# Patient Record
Sex: Female | Born: 1950
Health system: Southern US, Community
[De-identification: ages and names within clinical notes are randomized; demographics above are authoritative.]

## PROBLEM LIST (undated history)

## (undated) DIAGNOSIS — C801 Malignant (primary) neoplasm, unspecified: Secondary | ICD-10-CM

## (undated) DIAGNOSIS — M1990 Unspecified osteoarthritis, unspecified site: Secondary | ICD-10-CM

## (undated) DIAGNOSIS — N6320 Unspecified lump in the left breast, unspecified quadrant: Secondary | ICD-10-CM

## (undated) DIAGNOSIS — E7800 Pure hypercholesterolemia, unspecified: Secondary | ICD-10-CM

## (undated) DIAGNOSIS — E785 Hyperlipidemia, unspecified: Secondary | ICD-10-CM

## (undated) DIAGNOSIS — R7303 Prediabetes: Secondary | ICD-10-CM

## (undated) DIAGNOSIS — I10 Essential (primary) hypertension: Secondary | ICD-10-CM

## (undated) DIAGNOSIS — L409 Psoriasis, unspecified: Secondary | ICD-10-CM

## (undated) DIAGNOSIS — N950 Postmenopausal bleeding: Secondary | ICD-10-CM

## (undated) HISTORY — DX: Prediabetes: R73.03

## (undated) HISTORY — PX: TUBAL LIGATION: SHX77

## (undated) HISTORY — DX: Psoriasis, unspecified: L40.9

## (undated) HISTORY — DX: Postmenopausal bleeding: N95.0

## (undated) HISTORY — PX: ABDOMINAL HYSTERECTOMY: SHX81

## (undated) HISTORY — PX: BREAST EXCISIONAL BIOPSY: SUR124

## (undated) HISTORY — PX: CHOLECYSTECTOMY: SHX55

## (undated) HISTORY — PX: WISDOM TOOTH EXTRACTION: SHX21

---

## 1998-11-20 ENCOUNTER — Encounter: Payer: Self-pay | Admitting: Gynecology

## 1998-11-20 ENCOUNTER — Ambulatory Visit (HOSPITAL_COMMUNITY): Admission: RE | Admit: 1998-11-20 | Discharge: 1998-11-20 | Payer: Self-pay | Admitting: Gynecology

## 1999-05-10 ENCOUNTER — Other Ambulatory Visit: Admission: RE | Admit: 1999-05-10 | Discharge: 1999-05-10 | Payer: Self-pay | Admitting: Gynecology

## 2000-01-25 ENCOUNTER — Encounter: Admission: RE | Admit: 2000-01-25 | Discharge: 2000-01-25 | Payer: Self-pay | Admitting: Gynecology

## 2000-01-25 ENCOUNTER — Encounter: Payer: Self-pay | Admitting: Gynecology

## 2000-07-29 ENCOUNTER — Other Ambulatory Visit: Admission: RE | Admit: 2000-07-29 | Discharge: 2000-07-29 | Payer: Self-pay | Admitting: Gynecology

## 2001-01-29 ENCOUNTER — Encounter: Admission: RE | Admit: 2001-01-29 | Discharge: 2001-01-29 | Payer: Self-pay | Admitting: Gynecology

## 2001-01-29 ENCOUNTER — Encounter: Payer: Self-pay | Admitting: Gynecology

## 2001-10-20 ENCOUNTER — Other Ambulatory Visit: Admission: RE | Admit: 2001-10-20 | Discharge: 2001-10-20 | Payer: Self-pay | Admitting: Gynecology

## 2002-02-04 ENCOUNTER — Encounter: Payer: Self-pay | Admitting: Gynecology

## 2002-02-04 ENCOUNTER — Encounter: Admission: RE | Admit: 2002-02-04 | Discharge: 2002-02-04 | Payer: Self-pay | Admitting: Gynecology

## 2002-10-25 ENCOUNTER — Other Ambulatory Visit: Admission: RE | Admit: 2002-10-25 | Discharge: 2002-10-25 | Payer: Self-pay | Admitting: Gynecology

## 2003-05-05 ENCOUNTER — Encounter: Payer: Self-pay | Admitting: Gynecology

## 2003-05-05 ENCOUNTER — Encounter: Admission: RE | Admit: 2003-05-05 | Discharge: 2003-05-05 | Payer: Self-pay | Admitting: Gynecology

## 2003-10-27 ENCOUNTER — Other Ambulatory Visit: Admission: RE | Admit: 2003-10-27 | Discharge: 2003-10-27 | Payer: Self-pay | Admitting: Gynecology

## 2004-05-21 ENCOUNTER — Encounter: Admission: RE | Admit: 2004-05-21 | Discharge: 2004-05-21 | Payer: Self-pay | Admitting: Gynecology

## 2004-10-08 ENCOUNTER — Ambulatory Visit (HOSPITAL_COMMUNITY): Admission: RE | Admit: 2004-10-08 | Discharge: 2004-10-08 | Payer: Self-pay | Admitting: Gastroenterology

## 2004-12-13 ENCOUNTER — Other Ambulatory Visit: Admission: RE | Admit: 2004-12-13 | Discharge: 2004-12-13 | Payer: Self-pay | Admitting: Gynecology

## 2005-01-01 ENCOUNTER — Encounter: Admission: RE | Admit: 2005-01-01 | Discharge: 2005-01-01 | Payer: Self-pay | Admitting: Gynecology

## 2005-09-03 ENCOUNTER — Encounter: Admission: RE | Admit: 2005-09-03 | Discharge: 2005-09-03 | Payer: Self-pay | Admitting: Gynecology

## 2006-01-27 ENCOUNTER — Other Ambulatory Visit: Admission: RE | Admit: 2006-01-27 | Discharge: 2006-01-27 | Payer: Self-pay | Admitting: Gynecology

## 2006-08-28 ENCOUNTER — Emergency Department (HOSPITAL_COMMUNITY): Admission: EM | Admit: 2006-08-28 | Discharge: 2006-08-28 | Payer: Self-pay | Admitting: Emergency Medicine

## 2006-09-09 ENCOUNTER — Encounter: Admission: RE | Admit: 2006-09-09 | Discharge: 2006-09-09 | Payer: Self-pay | Admitting: Gynecology

## 2007-01-20 ENCOUNTER — Encounter: Admission: RE | Admit: 2007-01-20 | Discharge: 2007-01-20 | Payer: Self-pay | Admitting: Family Medicine

## 2007-02-12 ENCOUNTER — Ambulatory Visit (HOSPITAL_COMMUNITY): Admission: RE | Admit: 2007-02-12 | Discharge: 2007-02-12 | Payer: Self-pay | Admitting: Neurology

## 2007-11-24 ENCOUNTER — Encounter: Admission: RE | Admit: 2007-11-24 | Discharge: 2007-11-24 | Payer: Self-pay | Admitting: Gynecology

## 2008-11-25 ENCOUNTER — Encounter: Admission: RE | Admit: 2008-11-25 | Discharge: 2008-11-25 | Payer: Self-pay | Admitting: Gynecology

## 2009-10-24 ENCOUNTER — Ambulatory Visit (HOSPITAL_COMMUNITY): Admission: RE | Admit: 2009-10-24 | Discharge: 2009-10-24 | Payer: Self-pay | Admitting: Gastroenterology

## 2009-10-24 ENCOUNTER — Encounter (INDEPENDENT_AMBULATORY_CARE_PROVIDER_SITE_OTHER): Payer: Self-pay | Admitting: Gastroenterology

## 2009-11-30 ENCOUNTER — Encounter: Admission: RE | Admit: 2009-11-30 | Discharge: 2009-11-30 | Payer: Self-pay | Admitting: Gynecology

## 2011-01-25 ENCOUNTER — Other Ambulatory Visit: Payer: Self-pay | Admitting: Gynecology

## 2011-01-25 DIAGNOSIS — Z1231 Encounter for screening mammogram for malignant neoplasm of breast: Secondary | ICD-10-CM

## 2011-02-05 ENCOUNTER — Ambulatory Visit
Admission: RE | Admit: 2011-02-05 | Discharge: 2011-02-05 | Disposition: A | Discharge status: Home / Self Care | Payer: Commercial Managed Care - PPO | Source: Ambulatory Visit | Attending: Gynecology | Admitting: Gynecology

## 2011-02-05 DIAGNOSIS — Z1231 Encounter for screening mammogram for malignant neoplasm of breast: Secondary | ICD-10-CM

## 2011-04-25 ENCOUNTER — Other Ambulatory Visit: Payer: Self-pay | Admitting: Gynecology

## 2011-04-26 NOTE — Op Note (Signed)
Katherine Frederick, Katherine Frederick              ACCOUNT NO.:  [id number]   MEDICAL RECORD NO.:  [id number]          PATIENT TYPE:  AMB   LOCATION:  ENDO                         FACILITY:  [facility]   PHYSICIAN:  Bernette Redbird, M.D.   DATE OF BIRTH:  09/05/1951   DATE OF PROCEDURE:  10/08/2004  DATE OF DISCHARGE:                                 OPERATIVE REPORT   PROCEDURE:  Colonoscopy.   ENDOSCOPIST:  Bernette Redbird, M.D.   INDICATIONS FOR PROCEDURE:  Screening for colon cancer, in a patient with a  family history of a colonic adenoma, but no worrisome symptoms, and no prior  screening procedures.   FINDINGS:  Normal examination to the cecum.   INFORMED CONSENT:  The nature, purpose and risks of the procedure had been  discussed with the patient  who had provided a written consent.   SEDATION:  Fentanyl 75 mcg, Versed 7.5 mg IV, without arrhythmias or  desaturation.   DESCRIPTION OF PROCEDURE:  The Olympus adjustable tension pediatric video  colonoscope was readily advanced to the cecum, as identified by clear  visualization of the appendiceal orifice and a pullback was then performed.  The quality of the prep was excellent.  It is felt that all areas were well-  seen.  This was a normal examination.  No polyps, cancer, colitis, vascular  malformations, or diverticulosis were noted.  Retroflexion in the rectum was  unremarkable, as was reinspection of the rectum.  A tiny sessile  hyperplastic-appearing rectal polyp was transiently seen on retroflexion,  but could not be substantiated on antegrade viewing and thus it was felt to  be not clinically significant.  No biopsies were obtained.  The patient tolerated the procedure well, and there were no apparent  complications.   IMPRESSION:  Normal colonoscopy in a 60 year old asymptomatic patient with a  family history of colon polyps.   PLAN:  Followup colonoscopy in five years, in view of the family history.       RB/MEDQ  D:   10/08/2004  T:  10/08/2004  Job:  [id number]   cc:   Bryan Lemma. Manus Gunning, M.D.  [address] [place]  [place] [address]  Fax: [phone]

## 2012-02-27 ENCOUNTER — Encounter (HOSPITAL_COMMUNITY): Payer: Self-pay

## 2012-02-27 ENCOUNTER — Ambulatory Visit: Admit: 2012-02-27 | Payer: Self-pay | Admitting: Obstetrics and Gynecology

## 2012-02-27 ENCOUNTER — Inpatient Hospital Stay (HOSPITAL_COMMUNITY): Payer: 59 | Admitting: Anesthesiology

## 2012-02-27 ENCOUNTER — Encounter (HOSPITAL_COMMUNITY): Payer: Self-pay | Admitting: Anesthesiology

## 2012-02-27 ENCOUNTER — Ambulatory Visit (HOSPITAL_COMMUNITY)
Admission: AD | Admit: 2012-02-27 | Discharge: 2012-02-27 | Disposition: A | Discharge status: Home / Self Care | Payer: 59 | Source: Ambulatory Visit | Attending: Obstetrics and Gynecology | Admitting: Obstetrics and Gynecology

## 2012-02-27 ENCOUNTER — Other Ambulatory Visit: Payer: Self-pay | Admitting: Obstetrics and Gynecology

## 2012-02-27 ENCOUNTER — Encounter (HOSPITAL_COMMUNITY)
Admission: AD | Disposition: A | Discharge status: Home / Self Care | Payer: Self-pay | Source: Ambulatory Visit | Attending: Obstetrics and Gynecology

## 2012-02-27 DIAGNOSIS — N950 Postmenopausal bleeding: Principal | ICD-10-CM | POA: Insufficient documentation

## 2012-02-27 DIAGNOSIS — N840 Polyp of corpus uteri: Secondary | ICD-10-CM | POA: Insufficient documentation

## 2012-02-27 HISTORY — PX: HYSTEROSCOPY WITH D & C: SHX1775

## 2012-02-27 HISTORY — DX: Essential (primary) hypertension: I10

## 2012-02-27 HISTORY — DX: Pure hypercholesterolemia, unspecified: E78.00

## 2012-02-27 LAB — CBC
MCH: 30.1 pg (ref 26.0–34.0)
MCHC: 34.5 g/dL (ref 30.0–36.0)

## 2012-02-27 SURGERY — DILATATION AND CURETTAGE /HYSTEROSCOPY
Anesthesia: General | Site: Vagina | Wound class: Clean Contaminated

## 2012-02-27 MED ORDER — FENTANYL CITRATE 0.05 MG/ML IJ SOLN
INTRAMUSCULAR | Status: DC | PRN
  Administered 2012-02-27 (×2): 50 ug via INTRAVENOUS

## 2012-02-27 MED ORDER — CEFAZOLIN SODIUM 1-5 GM-% IV SOLN
1.0000 g | INTRAVENOUS | Status: AC
  Administered 2012-02-27: 1 g via INTRAVENOUS
  Filled 2012-02-27: qty 50

## 2012-02-27 MED ORDER — PROMETHAZINE HCL 25 MG/ML IJ SOLN: 6.2500 mg | INTRAMUSCULAR | Status: DC | PRN

## 2012-02-27 MED ORDER — MIDAZOLAM HCL 5 MG/5ML IJ SOLN
INTRAMUSCULAR | Status: DC | PRN
  Administered 2012-02-27: 2 mg via INTRAVENOUS

## 2012-02-27 MED ORDER — LIDOCAINE HCL (CARDIAC) 20 MG/ML IV SOLN
INTRAVENOUS | Status: DC | PRN
  Administered 2012-02-27: 100 mg via INTRAVENOUS

## 2012-02-27 MED ORDER — FENTANYL CITRATE 0.05 MG/ML IJ SOLN: 25.0000 ug | INTRAMUSCULAR | Status: DC | PRN

## 2012-02-27 MED ORDER — KETOROLAC TROMETHAMINE 30 MG/ML IJ SOLN
INTRAMUSCULAR | Status: DC | PRN
  Administered 2012-02-27: 30 mg via INTRAVENOUS

## 2012-02-27 MED ORDER — METOCLOPRAMIDE HCL 5 MG/ML IJ SOLN
INTRAMUSCULAR | Status: AC
  Filled 2012-02-27: qty 2

## 2012-02-27 MED ORDER — LIDOCAINE HCL 1 % IJ SOLN
INTRAMUSCULAR | Status: DC | PRN
  Administered 2012-02-27: 9 mL

## 2012-02-27 MED ORDER — SODIUM CHLORIDE 0.9 % IV SOLN
INTRAVENOUS | Status: DC
  Administered 2012-02-27: 13:00:00 via INTRAVENOUS

## 2012-02-27 MED ORDER — DEXAMETHASONE SODIUM PHOSPHATE 10 MG/ML IJ SOLN
INTRAMUSCULAR | Status: DC | PRN
  Administered 2012-02-27: 10 mg via INTRAVENOUS

## 2012-02-27 MED ORDER — ONDANSETRON HCL 4 MG/2ML IJ SOLN
INTRAMUSCULAR | Status: DC | PRN
  Administered 2012-02-27: 4 mg via INTRAVENOUS

## 2012-02-27 MED ORDER — KETOROLAC TROMETHAMINE 30 MG/ML IJ SOLN: 15.0000 mg | Freq: Once | INTRAMUSCULAR | Status: DC | PRN

## 2012-02-27 MED ORDER — PROPOFOL 10 MG/ML IV EMUL
INTRAVENOUS | Status: DC | PRN
  Administered 2012-02-27: 200 mg via INTRAVENOUS

## 2012-02-27 MED ORDER — MEPERIDINE HCL 25 MG/ML IJ SOLN: 6.2500 mg | INTRAMUSCULAR | Status: DC | PRN

## 2012-02-27 MED ORDER — FAMOTIDINE IN NACL 20-0.9 MG/50ML-% IV SOLN: 20.0000 mg | Freq: Once | INTRAVENOUS | Status: DC

## 2012-02-27 MED ORDER — FAMOTIDINE IN NACL 20-0.9 MG/50ML-% IV SOLN
INTRAVENOUS | Status: AC
  Administered 2012-02-27: 20 mg via INTRAVENOUS
  Filled 2012-02-27: qty 50

## 2012-02-27 SURGICAL SUPPLY — 17 items
CANISTER SUCTION 2500CC (MISCELLANEOUS) ×2 IMPLANT
CATH ROBINSON RED A/P 16FR (CATHETERS) ×2 IMPLANT
CLOTH BEACON ORANGE TIMEOUT ST (SAFETY) ×2 IMPLANT
CONTAINER PREFILL 10% NBF 60ML (FORM) ×2 IMPLANT
ELECT REM PT RETURN 9FT ADLT (ELECTROSURGICAL) ×2
ELECTRODE REM PT RTRN 9FT ADLT (ELECTROSURGICAL) ×1 IMPLANT
GLOVE BIOGEL PI IND STRL 6.5 (GLOVE) IMPLANT
GLOVE BIOGEL PI INDICATOR 6.5 (GLOVE) ×1
GLOVE ECLIPSE 6.0 STRL STRAW (GLOVE) ×1 IMPLANT
GLOVE ECLIPSE 7.0 STRL STRAW (GLOVE) ×4 IMPLANT
GOWN PREVENTION PLUS LG XLONG (DISPOSABLE) ×2 IMPLANT
GOWN PREVENTION PLUS XLARGE (GOWN DISPOSABLE) ×3 IMPLANT
GOWN STRL REIN XL XLG (GOWN DISPOSABLE) ×2 IMPLANT
LOOP ANGLED CUTTING 22FR (CUTTING LOOP) IMPLANT
PACK HYSTEROSCOPY LF (CUSTOM PROCEDURE TRAY) ×2 IMPLANT
TOWEL OR 17X24 6PK STRL BLUE (TOWEL DISPOSABLE) ×4 IMPLANT
WATER STERILE IRR 1000ML POUR (IV SOLUTION) ×2 IMPLANT

## 2012-02-27 NOTE — Discharge Instructions (Signed)
Hysteroscopy Hysteroscopy is a procedure used for looking inside the womb (uterus). It may be done for many different reasons, including: AFTER THE PROCEDURE   If you had a general anesthetic, you may be groggy for a couple hours after the procedure.   If you had a local anesthetic, you will be advised to rest at the surgical center or caregiver's office until you are stable and feel ready to go home.   You may have some cramping for a couple days.   You may have bleeding, which varies from light spotting for a few days to menstrual-like bleeding for up to 3 to 7 days. This is normal.   Have someone take you home.  FINDING OUT THE RESULTS OF YOUR TEST Not all test results are available during your visit. If your test results are not back during the visit, make an appointment with your caregiver to find out the results. Do not assume everything is normal if you have not heard from your caregiver or the medical facility. It is important for you to follow up on all of your test results. HOME CARE INSTRUCTIONS   Do not drive for 24 hours or as instructed.   Only take over-the-counter or prescription medicines for pain, discomfort, or fever as directed by your caregiver.   Do not take aspirin. It can cause or aggravate bleeding.   Do not drive or drink alcohol while taking pain medicine.   You may resume your usual diet.   Do not use tampons, douche, or have sexual intercourse for 2 weeks, or as advised by your caregiver.   Rest and sleep for the first 24 to 48 hours.   Take your temperature twice a day for 4 to 5 days. Write it down. Give these temperatures to your caregiver if they are abnormal (above 98.6 F or 37.0 C).   Take medicines your caregiver has ordered as directed.   Follow your caregiver's advice regarding diet, exercise, lifting, driving, and general activities.   Take showers instead of baths for 2 weeks, or as recommended by your caregiver.   If you develop  constipation:   Take a mild laxative with the advice of your caregiver.   Eat bran foods.   Drink enough water and fluids to keep your urine clear or pale yellow.   Try to have someone with you or available to you for the first 24 to 48 hours, especially if you had a general anesthetic.   Make sure you and your family understand everything about your operation and recovery.   Follow your caregiver's advice regarding follow-up appointments and Pap smears.  SEEK MEDICAL CARE IF:   You feel dizzy or lightheaded.   You feel sick to your stomach (nauseous).   You develop abnormal vaginal discharge.   You develop a rash.   You have an abnormal reaction or allergy to your medicine.   You need stronger pain medicine.  SEEK IMMEDIATE MEDICAL CARE IF:   Bleeding is heavier than a normal menstrual period or you have blood clots.   You have an oral temperature above 102 F (38.9 C), not controlled by medicine.   You have increasing cramps or pains not relieved with medicine.   You develop belly (abdominal) pain that does not seem to be related to the same area of earlier cramping and pain.   You pass out.   You develop pain in the tops of your shoulders (shoulder strap areas).   You develop shortness of  breath.  MAKE SURE YOU:   Understand these instructions.   Will watch your condition.   Will get help right away if you are not doing well or get worse.  Document Released: 03/03/2001 Document Revised: 11/14/2011 Document Reviewed: 06/26/2009 [facility] Patient Information 2012 [place], [place].

## 2012-02-27 NOTE — H&P (Signed)
Pt is a 61 year old white female who presented to the office today with bleeding. Pt had an episode of light bleeding in May 2012. An endometrial biopsy was benign. Her prior LMP was 2003. PE: HEENT- wnl Abd- non tender, no masses. Vagina- with  Blood, Uterus- nt, normal size IMP/ Postmenopausal Bleeding Plan/ Hysteroscopy, D&C

## 2012-02-27 NOTE — MAU Note (Signed)
Pt states bleeding began Monday, noted large clots. Bleeding worsened yesterday then stopped. Cramping severe last pm, was unable to sleep very well.

## 2012-02-27 NOTE — Anesthesia Postprocedure Evaluation (Signed)
Anesthesia Post Note  Patient: Katherine Frederick  Procedure(s) Performed: Procedure(s) (LRB): DILATATION AND CURETTAGE /HYSTEROSCOPY (N/A)  Anesthesia type: General  Patient location: PACU  Post pain: Pain level controlled  Post assessment: Post-op Vital signs reviewed  Last Vitals:  Filed Vitals:   02/27/12 1402  BP: 121/48  Pulse: 52  Temp: 36.6 C  Resp: 18    Post vital signs: Reviewed  Level of consciousness: sedated  Complications: No apparent anesthesia complicationsfj

## 2012-02-27 NOTE — Transfer of Care (Signed)
Immediate Anesthesia Transfer of Care Note  Patient: Katherine Frederick  Procedure(s) Performed: Procedure(s) (LRB): DILATATION AND CURETTAGE /HYSTEROSCOPY (N/A)  Patient Location: PACU  Anesthesia Type: General  Level of Consciousness: awake, alert  and patient cooperative  Airway & Oxygen Therapy: Patient Spontanous Breathing and Patient connected to nasal cannula oxygen  Post-op Assessment: Report given to PACU RN  Post vital signs: Reviewed  Complications: No apparent anesthesia complications

## 2012-02-27 NOTE — Preoperative (Signed)
Beta Blockers   Reason not to administer Beta Blockers:Not Applicable 

## 2012-02-27 NOTE — Anesthesia Preprocedure Evaluation (Addendum)
Anesthesia Evaluation  Patient identified by MRN, date of birth, ID band Patient awake    Reviewed: Allergy & Precautions, H&P , NPO status , Patient's Chart, lab work & pertinent test results, reviewed documented beta blocker date and time   Airway Mallampati: II TM Distance: >3 FB Neck ROM: full    Dental  (+) Teeth Intact   Pulmonary neg pulmonary ROS,    Pulmonary exam normal       Cardiovascular hypertension, Pt. on medications and Pt. on home beta blockers     Neuro/Psych negative neurological ROS  negative psych ROS   GI/Hepatic negative GI ROS, Neg liver ROS,   Endo/Other  Morbid obesity  Renal/GU negative Renal ROS  negative genitourinary   Musculoskeletal negative musculoskeletal ROS (+)   Abdominal (+) + obese,   Peds negative pediatric ROS (+)  Hematology negative hematology ROS (+)   Anesthesia Other Findings   Reproductive/Obstetrics negative OB ROS                           Anesthesia Physical Anesthesia Plan  ASA: III  Anesthesia Plan: General   Post-op Pain Management:    Induction: Intravenous  Airway Management Planned: LMA  Additional Equipment:   Intra-op Plan:   Post-operative Plan:   Informed Consent: I have reviewed the patients History and Physical, chart, labs and discussed the procedure including the risks, benefits and alternatives for the proposed anesthesia with the patient or authorized representative who has indicated his/her understanding and acceptance.     Plan Discussed with: CRNA and Surgeon  Anesthesia Plan Comments:         Anesthesia Quick Evaluation

## 2012-02-28 NOTE — Op Note (Signed)
NAMEELICA, Katherine Frederick              ACCOUNT NO.:  [id number]  MEDICAL RECORD NO.:  [id number]  LOCATION:  [facility]                          FACILITY:  [facility]  PHYSICIAN:  Malva Limes, M.D.    DATE OF BIRTH:  1951-12-02  DATE OF PROCEDURE:  02/27/2012 DATE OF DISCHARGE:  02/27/2012                              OPERATIVE REPORT   PREOPERATIVE DIAGNOSIS:  Postmenopausal bleeding.  POSTOPERATIVE DIAGNOSIS:  Postmenopausal bleeding with large endometrial polyp.  PROCEDURES: 1. Diagnostic hysteroscopy. 2. Dilation and curettage 3. Polypectomy.  SURGEON:  Malva Limes, MD.  ANESTHESIA:  General paracervical block.  ANTIBIOTICS:  Ancef 1 g.  DRAINS:  Red rubber catheter, bladder.  SPECIMENS:  Polyp and endometrial curettings sent to Pathology.  COMPLICATIONS:  None.  ESTIMATED BLOOD LOSS:  25 mL.  PROCEDURE IN DETAIL:  The patient was taken to the operating room, where she was placed in dorsal supine position and general anesthetic was administered without difficulty.  She was placed in dorsal lithotomy position.  She was prepped and draped in usual fashion for this procedure.  A sterile speculum placed in the vagina.  10 mL of 1% lidocaine was used for paracervical block.  Single-tooth tenaculum was applied to the anterior cervical lip.  The patient had excessive amount of clots coming through the dilated cervical os.  These were removed with a ring forceps.  Following this, the hysteroscope was advanced into the endometrial cavity, it appeared that the large cystic-appearing mass with the base in the right cornual region.  Once this was located, the hysteroscope was removed and a ring forceps and polyp forceps were used to remove this large mass.  The mass appeared to be approximately 2 x 4 cm.  It was more thickened than a typical polyp and perhaps was a degenerated submucous fibroid.  Following this, the hysteroscope was placed back into the uterine cavity and appeared that  the entire mass had been removed.  Sharp curettage was then performed with tissue sent to Pathology.  An ECC was also performed. The uterus did sound to 11 cm.  At the conclusion of this, minimal bleeding was noted.  The patient was awoken and taken to recovery room in stable condition.  Instrument, lap count was correct x2.  The patient will be discharged to home.  She will follow up in the office in 2 weeks.          ______________________________ Malva Limes, M.D.     MA/MEDQ  D:  02/27/2012  T:  02/28/2012  Job:  [id number]

## 2012-03-02 ENCOUNTER — Encounter (HOSPITAL_COMMUNITY): Payer: Self-pay | Admitting: Obstetrics and Gynecology

## 2012-03-04 ENCOUNTER — Other Ambulatory Visit: Payer: Self-pay | Admitting: Gynecology

## 2012-03-04 DIAGNOSIS — Z1231 Encounter for screening mammogram for malignant neoplasm of breast: Secondary | ICD-10-CM

## 2012-03-19 ENCOUNTER — Ambulatory Visit
Admission: RE | Admit: 2012-03-19 | Discharge: 2012-03-19 | Disposition: A | Discharge status: Home / Self Care | Payer: 59 | Source: Ambulatory Visit | Attending: Gynecology | Admitting: Gynecology

## 2012-03-19 DIAGNOSIS — Z1231 Encounter for screening mammogram for malignant neoplasm of breast: Secondary | ICD-10-CM

## 2013-04-13 ENCOUNTER — Encounter: Payer: 59 | Attending: Family Medicine | Admitting: *Deleted

## 2013-04-13 DIAGNOSIS — Z713 Dietary counseling and surveillance: Secondary | ICD-10-CM | POA: Insufficient documentation

## 2013-04-13 DIAGNOSIS — E119 Type 2 diabetes mellitus without complications: Secondary | ICD-10-CM | POA: Insufficient documentation

## 2013-04-17 ENCOUNTER — Encounter: Payer: Self-pay | Admitting: *Deleted

## 2013-04-17 NOTE — Progress Notes (Signed)
Patient was seen on 04/13/2013 for the first of a series of three diabetes self-management courses at the [facility]. Patient's most recent A1c was 5.7 % on 03/03/2013 The following learning objectives were met by the patient during this course:   Defines the role of glucose and insulin  Identifies type of diabetes and pathophysiology  Defines the diagnostic criteria for diabetes and prediabetes  States the risk factors for Type 2 Diabetes  States the symptoms of Type 2 Diabetes  Defines Type 2 Diabetes treatment goals  Defines Type 2 Diabetes treatment options  States the rationale for glucose monitoring  Identifies A1C, glucose targets, and testing times  Identifies proper sharps disposal  Defines the purpose of a diabetes food plan  Identifies carbohydrate food groups  Defines effects of carbohydrate foods on glucose levels  Identifies carbohydrate choices/grams/food labels  States benefits of physical activity and effect on glucose  Review of suggested activity guidelines  Handouts given during class include:  Type 2 Diabetes: Basics Book  My Food Plan Book  Food and Activity Log   Follow-Up Plan: Core Class 2

## 2013-04-17 NOTE — Patient Instructions (Signed)
Goals:  Follow Diabetes Meal Plan as instructed  Eat 3 meals and 2 snacks, every 3-5 hrs  Limit carbohydrate intake to 30-45 grams carbohydrate/meal  Limit carbohydrate intake to 0-15 grams carbohydrate/snack  Add lean protein foods to meals/snacks  Monitor glucose levels as instructed by your doctor  Aim for 15-30 mins of physical activity daily  Bring food record and glucose log to your next nutrition visit   

## 2013-05-20 ENCOUNTER — Other Ambulatory Visit: Payer: Self-pay

## 2013-05-20 DIAGNOSIS — Z1231 Encounter for screening mammogram for malignant neoplasm of breast: Secondary | ICD-10-CM

## 2013-06-15 ENCOUNTER — Encounter: Payer: 59 | Attending: Family Medicine | Admitting: *Deleted

## 2013-06-15 DIAGNOSIS — E119 Type 2 diabetes mellitus without complications: Secondary | ICD-10-CM | POA: Insufficient documentation

## 2013-06-15 DIAGNOSIS — Z713 Dietary counseling and surveillance: Secondary | ICD-10-CM | POA: Insufficient documentation

## 2013-06-16 NOTE — Progress Notes (Signed)
  Patient was seen on 06/15/13 for the second of a series of three diabetes self-management courses at the [facility]. The following learning objectives were met by the patient during this course:   Explain basic nutrition maintenance and quality assurance  Describe causes, symptoms and treatment of hypoglycemia and hyperglycemia  Explain how to manage diabetes during illness  Describe the importance of good nutrition for health and healthy eating strategies  List strategies to follow meal plan when dining out  Describe the effects of alcohol on glucose and how to use it safely  Describe problem solving skills for day-to-day glucose challenges  Describe strategies to use when treatment plan needs to change  Identify important factors involved in successful weight loss  Describe ways to remain physically active  Describe the impact of regular activity on insulin resistance   Handouts given in class:  Refrigerator magnet for Sick Day Guidelines  [facility] Oral medication/insulin handout  Follow-Up Plan: Patient will attend the final class of the ADA Diabetes Self-Care Education.    

## 2013-06-17 ENCOUNTER — Encounter: Payer: 59 | Admitting: *Deleted

## 2013-06-17 DIAGNOSIS — E119 Type 2 diabetes mellitus without complications: Secondary | ICD-10-CM

## 2013-06-17 NOTE — Progress Notes (Signed)
Patient was seen on 06/17/13 for the third of a series of three diabetes self-management courses at the [facility]. The following learning objectives were met by the patient during this course:   Describe how diabetes changes over time  Identify diabetes complications and ways to prevent them  Describe strategies that can promote heart health including lowering blood pressure and cholesterol  Describe strategies to lower dietary fat and sodium in the diet  Identify physical activities that benefit cardiovascular health  Evaluate success in meeting personal goal  Describe the belief that they can live successfully with diabetes day to day  Establish 2-3 goals that they will plan to diligently work on until they return for the free 37-month follow-up visit  The following handouts were given in class:  3 Month Follow Up Visit handout  Goal setting handout  Class evaluation form  Your patient has established the following 3 month goals for diabetes self-care:  Count carbohydrates at most meals and snacks Test glucose 2 times a week  Follow-Up Plan:  Patient will attend a 3 month follow-up visit for diabetes self-management education.

## 2013-07-01 ENCOUNTER — Ambulatory Visit
Admission: RE | Admit: 2013-07-01 | Discharge: 2013-07-01 | Disposition: A | Discharge status: Home / Self Care | Payer: 59 | Source: Ambulatory Visit

## 2013-07-01 ENCOUNTER — Ambulatory Visit: Payer: 59

## 2013-07-01 DIAGNOSIS — Z1231 Encounter for screening mammogram for malignant neoplasm of breast: Secondary | ICD-10-CM

## 2013-09-16 ENCOUNTER — Encounter: Payer: 59 | Attending: Family Medicine | Admitting: *Deleted

## 2013-09-16 DIAGNOSIS — Z713 Dietary counseling and surveillance: Secondary | ICD-10-CM | POA: Insufficient documentation

## 2013-09-16 DIAGNOSIS — E119 Type 2 diabetes mellitus without complications: Secondary | ICD-10-CM | POA: Insufficient documentation

## 2013-09-16 NOTE — Progress Notes (Signed)
  Patient was seen on 09/16/13 for their 3 month follow-up as a part of the diabetes self-management courses at the [facility]. The following learning objectives were met by your patient during this course:  Patient self reports the following: She works nights so eating habits difficult. Brought her meter with her and indicates range of 80-125 including both pre and post meal.  Diabetes control has improved since diabetes self-management training: continued to be in good control. Number of days blood glucose is >200: none Last MD appointment for diabetes: yes, last A1c was 5.9% on August 22nd Changes in treatment plan: no Confidence with ability to manage diabetes: more aware of carbs now Areas for improvement with diabetes self-care: would like to lose at least 20 pounds Willingness to participate in diabetes support group: PERHAPS  Your patient has established the following 3 month goals for diabetes self-care:  Count carbohydrates at most meals and snacks  Test glucose 2 times a week - TESTS ONCE OR TWICE A WEEK.  Please see Diabetes Flow sheet for findings related to patient's self-care.  Follow-Up Plan: Patient is eligible for a "free" 30 minute diabetes self-care appointment in the next year. Patient to call and schedule as needed.

## 2013-10-14 ENCOUNTER — Ambulatory Visit: Payer: 59 | Admitting: *Deleted

## 2013-10-29 ENCOUNTER — Encounter: Payer: 59 | Attending: Family Medicine | Admitting: *Deleted

## 2013-10-29 ENCOUNTER — Encounter: Payer: Self-pay | Admitting: *Deleted

## 2013-10-29 DIAGNOSIS — Z713 Dietary counseling and surveillance: Secondary | ICD-10-CM | POA: Insufficient documentation

## 2013-10-29 DIAGNOSIS — E119 Type 2 diabetes mellitus without complications: Secondary | ICD-10-CM | POA: Insufficient documentation

## 2013-10-29 NOTE — Progress Notes (Signed)
Appt start time: 0800 end time:  0830.  Assessment:  Patient was seen on  10/29/13 for individual diabetes education. She attended the Core Classes and her BGs are in good control now. She is following up for assistance with weight control. She provided me with a diet history which includes sweets and desserts on a regular basis. She does exercise by going to the gym and meeting with personal trainer 2 days a week. 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  Current HbA1c: 5.9%  MEDICATIONS: see list  DIETARY INTAKE:  24-hr recall:  B ( AM): meets friend for breakfast after work; eggs, grits toast OR [place] toast OR omelet with grits and toast with jelly and occasionally butter, water  Snk ( AM): sleeps  L (4 PM): weight watcher bar if going in to work, if off - eats out most nights; soup and sandwich, water Snk ( PM): none D (11 PM): may bring left overs OR 6" Subway flatbread sandwich, occasionally bag of chips,  Snk ( PM): infrequently PNB crackers OR popcorn Beverages: water  Usual physical activity: gym with trainer twice a week  Estimated energy needs: 1400 calories 158 g carbohydrates 105 g protein 39 g fat  Progress Towards Goal(s):  In progress.   Nutritional Diagnosis:  NI-1.5 Excessive energy intake As related to activity level.  As evidenced by BMI of 34.6    Intervention:  Follow up nutrition counseling provided.  Suggested more awareness of her behaviors to include putting food on plate rather than eating out of the container, setting particular days of the week for desserts rather than eating thme on impulse, and discussed the need for more frequent exercise during the week.  Plan:  If you decide to have dessert, plan to eat less carb choices at that meal Consider frequency to have dessert and plan for those particular days of the week Consider putting snack foods on plate when watching tv Continue with your activity level by  going to gym 2 days a week or more as able Consider other options for increasing your activity level on other days  Handouts given during visit include:  Carb Counting handout  Barriers to learning/adherence to lifestyle change: commitment after being on different diets throughout adulthood  Demonstrated degree of understanding via:  Teach Back   Monitoring/Evaluation:  Dietary intake, exercise, increased awareness of portion sizes, and body weight in 4 week(s).

## 2013-10-29 NOTE — Patient Instructions (Signed)
Plan:  If you decide to have dessert, plan to eat less carb choices at that meal Consider frequency to have dessert and plan for those particular days of the week Consider putting snack foods on plate when watching tv Continue with your activity level by going to gym 2 days a week or more as able Consider other options for increasing your activity level on other days

## 2013-12-23 ENCOUNTER — Encounter: Payer: 59 | Attending: Family Medicine | Admitting: *Deleted

## 2013-12-23 ENCOUNTER — Encounter: Payer: Self-pay | Admitting: *Deleted

## 2013-12-23 VITALS — Ht 65.0 in | Wt 206.0 lb

## 2013-12-23 DIAGNOSIS — E119 Type 2 diabetes mellitus without complications: Secondary | ICD-10-CM | POA: Insufficient documentation

## 2013-12-23 DIAGNOSIS — Z713 Dietary counseling and surveillance: Secondary | ICD-10-CM | POA: Insufficient documentation

## 2013-12-23 NOTE — Progress Notes (Signed)
Appt start time: 1230 end time:  1300.  Assessment:  Patient was seen on  12/23/13 for individual diabetes education follow up. She states she is checking her BG a little less than once or twice a week and the highest BG reading has been 111 mg/dl. She states during the holidays she was not successful with decreasing her dessert intake, but is ready to work on that in the [employer]. She does exercise by going to the gym and meeting with personal trainer 2 days a week and she does walk a lot with her job. Weight loss of 1 pound noted since last visit in November.  Current HbA1c: 5.9%  MEDICATIONS: see list  DIETARY INTAKE:  24-hr recall:  B ( AM): meets friend for breakfast after work; eggs, grits toast OR [place] toast OR omelet with grits and toast with jelly and occasionally butter, water  Snk ( AM): sleeps  L (4 PM): weight watcher bar if going in to work, if off - eats out most nights; soup and sandwich, water Snk ( PM): none D (11 PM): may bring left overs OR 6" Subway flatbread sandwich, occasionally bag of chips,  Snk ( PM): infrequently PNB crackers OR popcorn Beverages: water  Usual physical activity: gym with trainer twice a week  Estimated energy needs: 1400 calories 158 g carbohydrates 105 g protein 39 g fat  Progress Towards Goal(s):  In progress.   Nutritional Diagnosis:  NI-1.5 Excessive energy intake As related to activity level.  As evidenced by BMI of 34.6    Intervention:  Follow up nutrition counseling provided. Commended her on her positive changes she has accomplished and encouraged her to follow up with modifying her dessert intake as able. Suggested more awareness of her behaviors to include putting food on plate rather than eating out of the container, setting particular days of the week for desserts rather than eating thme on impulse, and discussed the need for more frequent exercise during the week.  Plan:  Continue that when you decide to have dessert, plan  to eat less carb choices at that meal Consider frequency to have dessert and plan for those particular days of the week Consider putting snack foods on plate when watching tv OR perhaps eating only at the dining room table Continue with your activity level by going to gym 2 days a week or more as able Continue other options for increasing your activity level such as walking and taking the stairs on other days Consider cleaning out your kitchen    Handouts given during visit include:  Carb Counting handout  Barriers to learning/adherence to lifestyle change: commitment after being on different diets throughout adulthood  Demonstrated degree of understanding via:  Teach Back   Monitoring/Evaluation:  Dietary intake, exercise, increased awareness of portion sizes, and body weight PRN.

## 2013-12-23 NOTE — Patient Instructions (Signed)
Plan:  Continue that when you decide to have dessert, plan to eat less carb choices at that meal Consider frequency to have dessert and plan for those particular days of the week Consider putting snack foods on plate when watching tv OR perhaps eating only at the dining room table Continue with your activity level by going to gym 2 days a week or more as able Continue other options for increasing your activity level such as walking and taking the stairs on other days Consider cleaning out your kitchen

## 2014-06-27 ENCOUNTER — Other Ambulatory Visit: Payer: Self-pay | Admitting: Gynecology

## 2014-06-28 LAB — CYTOLOGY - PAP

## 2014-08-05 ENCOUNTER — Other Ambulatory Visit: Payer: Self-pay

## 2014-08-05 DIAGNOSIS — Z1231 Encounter for screening mammogram for malignant neoplasm of breast: Secondary | ICD-10-CM

## 2014-08-09 ENCOUNTER — Encounter (INDEPENDENT_AMBULATORY_CARE_PROVIDER_SITE_OTHER): Payer: Self-pay

## 2014-08-09 ENCOUNTER — Ambulatory Visit
Admission: RE | Admit: 2014-08-09 | Discharge: 2014-08-09 | Disposition: A | Discharge status: Home / Self Care | Payer: 59 | Source: Ambulatory Visit

## 2014-08-09 DIAGNOSIS — Z1231 Encounter for screening mammogram for malignant neoplasm of breast: Secondary | ICD-10-CM

## 2014-10-10 ENCOUNTER — Encounter: Payer: Self-pay | Admitting: *Deleted

## 2014-12-09 HISTORY — PX: COLONOSCOPY: SHX174

## 2015-08-24 ENCOUNTER — Other Ambulatory Visit: Payer: Self-pay

## 2015-08-24 DIAGNOSIS — Z1231 Encounter for screening mammogram for malignant neoplasm of breast: Secondary | ICD-10-CM

## 2015-09-05 ENCOUNTER — Ambulatory Visit
Admission: RE | Admit: 2015-09-05 | Discharge: 2015-09-05 | Disposition: A | Discharge status: Home / Self Care | Payer: 59 | Source: Ambulatory Visit

## 2015-09-05 DIAGNOSIS — Z1231 Encounter for screening mammogram for malignant neoplasm of breast: Secondary | ICD-10-CM

## 2015-12-28 ENCOUNTER — Other Ambulatory Visit: Payer: Self-pay | Admitting: Nurse Practitioner

## 2015-12-28 DIAGNOSIS — N950 Postmenopausal bleeding: Secondary | ICD-10-CM | POA: Diagnosis not present

## 2015-12-28 DIAGNOSIS — R938 Abnormal findings on diagnostic imaging of other specified body structures: Secondary | ICD-10-CM | POA: Diagnosis not present

## 2016-01-11 MED FILL — MELOXICAM 15 MG TABLET: 15 | 20 days supply | Qty: 20 | Fill #0

## 2016-01-16 ENCOUNTER — Other Ambulatory Visit: Payer: Self-pay | Admitting: Nurse Practitioner

## 2016-01-16 DIAGNOSIS — R938 Abnormal findings on diagnostic imaging of other specified body structures: Secondary | ICD-10-CM | POA: Diagnosis not present

## 2016-01-16 DIAGNOSIS — N950 Postmenopausal bleeding: Secondary | ICD-10-CM | POA: Diagnosis not present

## 2016-01-24 MED FILL — SIMVASTATIN 20 MG TABLET: 20 | 90 days supply | Qty: 90 | Fill #3

## 2016-01-24 MED FILL — VIT D2 1.25 MG (50,000 UNIT: 1.25 MG | 84 days supply | Qty: 12 | Fill #1

## 2016-01-26 ENCOUNTER — Ambulatory Visit
Admission: RE | Admit: 2016-01-26 | Discharge: 2016-01-26 | Disposition: A | Discharge status: Home / Self Care | Payer: 59 | Source: Ambulatory Visit | Attending: Family Medicine | Admitting: Family Medicine

## 2016-01-26 ENCOUNTER — Other Ambulatory Visit: Payer: Self-pay | Admitting: Family Medicine

## 2016-01-26 DIAGNOSIS — R079 Chest pain, unspecified: Secondary | ICD-10-CM | POA: Diagnosis not present

## 2016-01-26 DIAGNOSIS — R0600 Dyspnea, unspecified: Secondary | ICD-10-CM

## 2016-01-26 DIAGNOSIS — R0609 Other forms of dyspnea: Principal | ICD-10-CM

## 2016-01-26 DIAGNOSIS — R0602 Shortness of breath: Secondary | ICD-10-CM | POA: Diagnosis not present

## 2016-01-26 DIAGNOSIS — I10 Essential (primary) hypertension: Secondary | ICD-10-CM | POA: Diagnosis not present

## 2016-01-26 MED FILL — LOSARTAN POTASSIUM 100 MG T: 100 | 90 days supply | Qty: 90 | Fill #0

## 2016-01-26 MED FILL — HYDROCHLOROTHIAZIDE 25 MG T: 25 | 90 days supply | Qty: 90 | Fill #0

## 2016-01-29 ENCOUNTER — Ambulatory Visit (INDEPENDENT_AMBULATORY_CARE_PROVIDER_SITE_OTHER): Payer: 59 | Admitting: Cardiology

## 2016-01-29 ENCOUNTER — Encounter: Payer: Self-pay | Admitting: Cardiology

## 2016-01-29 VITALS — BP 146/78 | HR 67 | Ht 64.5 in | Wt 221.0 lb

## 2016-01-29 DIAGNOSIS — R0789 Other chest pain: Secondary | ICD-10-CM

## 2016-01-29 DIAGNOSIS — R0600 Dyspnea, unspecified: Secondary | ICD-10-CM

## 2016-01-29 DIAGNOSIS — E1122 Type 2 diabetes mellitus with diabetic chronic kidney disease: Secondary | ICD-10-CM | POA: Diagnosis not present

## 2016-01-29 DIAGNOSIS — I10 Essential (primary) hypertension: Secondary | ICD-10-CM | POA: Insufficient documentation

## 2016-01-29 DIAGNOSIS — E785 Hyperlipidemia, unspecified: Secondary | ICD-10-CM | POA: Insufficient documentation

## 2016-01-29 DIAGNOSIS — E669 Obesity, unspecified: Secondary | ICD-10-CM

## 2016-01-29 DIAGNOSIS — N181 Chronic kidney disease, stage 1: Secondary | ICD-10-CM

## 2016-01-29 NOTE — Progress Notes (Signed)
Cardiology Office Note    Date:  01/29/2016   ID:  Katherine Frederick, DOB [birth year], MRN [id number]  PCP:  Simona Huh, MD  Cardiologist:   Candee Furbish, MD     History of Present Illness:  Katherine Frederick is a 65 y.o. female here for the evaluation of chest pain at the request of Dr. Marisue Humble.   She works as a Marine scientist at [employer], helps with bed control issues, overflow issues.  Has experienced exertional chest discomfort with history of hypertension, diabetes, hyperlipidemia. EKG performed on 01/26/16 shows no ischemic changes. Has never smoked. She also had complaints of shortness of breath and chest pressure with exercise. Started upper abd and move up. Walking up to [facility] hospital through parking lot sometimes feel short of breath. At night at times when laying down she felt short of breath. She also describes occasional snoring. One time she woke up from snoring. Her father had cardiomyopathy.   She is had her medications adjusted, increased her Cozaar from 50-100 mg and her hydrochlorothiazide from 12.5-25 mg. She says this is helping.      Past Medical History  Diagnosis Date  . Hypertension   . Hypercholesteremia   . Pre-diabetes   . Type 2 diabetes mellitus with stage 1 chronic kidney disease, without long-term current use of insulin ([place]) 01/29/2016    Past Surgical History  Procedure Laterality Date  . Cesarean section    . Cholecystectomy    . Tubal ligation    . Wisdom tooth extraction    . Hysteroscopy w/d&c  02/27/2012    Procedure: DILATATION AND CURETTAGE /HYSTEROSCOPY;  Surgeon: Olga Millers, MD;  Location: [facility];  Service: Gynecology;  Laterality: N/A;    Outpatient Prescriptions Prior to Visit  Medication Sig Dispense Refill  . aspirin 81 MG tablet Take 81 mg by mouth daily.    . calcium carbonate (OS-CAL) 600 MG TABS Take 600 mg by mouth at bedtime.    . clobetasol cream (TEMOVATE) [id number] % Apply 1 application topically daily as needed.  For psoriasis    . hydrochlorothiazide (MICROZIDE) 12.5 MG capsule Take 25 mg by mouth daily.     Marland Kitchen losartan (COZAAR) 50 MG tablet Take 100 mg by mouth daily.     . metoprolol (LOPRESSOR) 50 MG tablet Take 50 mg by mouth 2 (two) times daily.    . Multiple Vitamin (MULITIVITAMIN WITH MINERALS) TABS Take 1 tablet by mouth daily.    Marland Kitchen PRESCRIPTION MEDICATION Apply 1 application topically daily as needed. Compounded gel, Salicylic Acid/Coal Tar Extract/Petroleum 5% for Psoriasis    . simvastatin (ZOCOR) 20 MG tablet Take 20 mg by mouth every evening.    . Vitamin D, Ergocalciferol, (DRISDOL) 50000 UNITS CAPS Take 50,000 Units by mouth every 7 (seven) days. mondays     No facility-administered medications prior to visit.     Allergies:   Lisinopril and Macrodantin   Social History   Social History  . Marital Status: Widowed    Spouse Name: N/A  . Number of Children: N/A  . Years of Education: N/A   Social History Main Topics  . Smoking status: Never Smoker   . Smokeless tobacco: Never Used  . Alcohol Use: No  . Drug Use: No  . Sexual Activity: Not Currently   Other Topics Concern  . None   Social History Narrative     Family History:  The patient's family history is negative for Anesthesia problems, Hypotension, Malignant  hyperthermia, and Pseudochol deficiency.  Father hasd CHF.   ROS:   Please see the history of present illness.    ROS All other systems reviewed and are negative.   PHYSICAL EXAM:   VS:  BP 146/78 mmHg  Pulse 67  Ht 5' 4.5" (1.638 m)  Wt 221 lb (100.245 kg)  BMI 37.36 kg/m2  LMP 02/24/2012   GEN: Well nourished, well developed, in no acute distress HEENT: normal Neck: no JVD, carotid bruits, or masses Cardiac: RRR; no murmurs, rubs, or gallops,no edema  Respiratory:  clear to auscultation bilaterally, normal work of breathing GI: soft, nontender, nondistended, + BS, overweight MS: no deformity or atrophy Skin: warm and dry, no rash Neuro:  Alert  and Oriented x 3, Strength and sensation are intact Psych: euthymic mood, full affect  Wt Readings from Last 3 Encounters:  01/29/16 221 lb (100.245 kg)  01/01/14 206 lb (93.441 kg)  10/29/13 207 lb 6.4 oz (94.076 kg)      Studies/Labs Reviewed:   EKG:  EKG is ordered today.  The ekg ordered today demonstrates 01/29/16 normal sinus rhythm, 67, no other significant abnormalities.  Recent Labs: No results found for requested labs within last 365 days.   Lipid Panel No results found for: CHOL, TRIG, HDL, CHOLHDL, VLDL, LDLCALC, LDLDIRECT  Additional studies/ records that were reviewed today include:  Office notes from [place] reviewed, EKG reviewed, lab work reviewed. Last creatinine 0.8.    ASSESSMENT:    1. Other chest pain   2. Type 2 diabetes mellitus with stage 1 chronic kidney disease, without long-term current use of insulin ([place])   3. Essential hypertension   4. Hyperlipidemia      PLAN:  In order of problems listed above:  1. Chest pain with exertion-possible anginal symptom. Given her prior diagnosis of diabetes, hypertension, hyperlipidemia and would be reasonable to proceed with stress test for further evaluation. I will like for her to stay on her metoprolol since her blood pressure is mildly elevated. If she is unable to reach target heart rate on treadmill, switch to [employer]. 2. Dyspnea on exertion, dyspnea at rest at times-I will check an echocardiogram to ensure proper structure and function of her heart. Her father had dilated cardiomyopathy. 3. Diabetes-currently under diet control. On losartan for renal protection. 4. Hyperlipidemia-currently using statin. Excellent prevention with diabetes. 5. Essential hypertension-currently mildly elevated. Medications reviewed. Her medications have recently been increased. 6. Obesity-continue to encourage weight loss. This will help with hypertension as well. 7. Snoring-could consider sleep study in future. Continue to  update Dr. Marisue Humble.    Medication Adjustments/Labs and Tests Ordered: Current medicines are reviewed at length with the patient today.  Concerns regarding medicines are outlined above.  Medication changes, Labs and Tests ordered today are listed in the Patient Instructions below. There are no Patient Instructions on file for this visit.     Bobby Rumpf, MD  01/29/2016 10:11 AM    [address], [place], [place]  [address] Phone: [phone]; Fax: [phone]

## 2016-01-29 NOTE — Patient Instructions (Signed)
Medication Instructions:  The current medical regimen is effective;  continue present plan and medications.  Testing/Procedures: Your physician has requested that you have an echocardiogram. Echocardiography is a painless test that uses sound waves to create images of your heart. It provides your doctor with information about the size and shape of your heart and how well your heart's chambers and valves are working. This procedure takes approximately one hour. There are no restrictions for this procedure.  Your physician has requested that you have a  myoview. For further information please visit [email or web address]. Please follow instruction sheet, as given.  Follow-Up: Follow up as needed after testing.  If you need a refill on your cardiac medications before your next appointment, please call your pharmacy.

## 2016-02-07 ENCOUNTER — Telehealth (HOSPITAL_COMMUNITY): Payer: Self-pay | Admitting: *Deleted

## 2016-02-07 NOTE — Telephone Encounter (Signed)
Patient given detailed instructions per Myocardial Perfusion Study Information Sheet for the test on 02/12/16  Patient notified to arrive 15 minutes early and that it is imperative to arrive on time for appointment to keep from having the test rescheduled.  If you need to cancel or reschedule your appointment, please call the office within 24 hours of your appointment. Failure to do so may result in a cancellation of your appointment, and a $50 no show fee. Patient verbalized understanding Hubbard Robinson, RN

## 2016-02-09 DIAGNOSIS — N840 Polyp of corpus uteri: Secondary | ICD-10-CM | POA: Diagnosis not present

## 2016-02-09 DIAGNOSIS — N950 Postmenopausal bleeding: Secondary | ICD-10-CM | POA: Diagnosis not present

## 2016-02-12 ENCOUNTER — Ambulatory Visit (HOSPITAL_COMMUNITY): Payer: 59 | Attending: Cardiovascular Disease

## 2016-02-12 ENCOUNTER — Other Ambulatory Visit: Payer: Self-pay

## 2016-02-12 ENCOUNTER — Ambulatory Visit (HOSPITAL_BASED_OUTPATIENT_CLINIC_OR_DEPARTMENT_OTHER): Payer: 59

## 2016-02-12 DIAGNOSIS — E785 Hyperlipidemia, unspecified: Secondary | ICD-10-CM | POA: Diagnosis not present

## 2016-02-12 DIAGNOSIS — R0600 Dyspnea, unspecified: Secondary | ICD-10-CM | POA: Insufficient documentation

## 2016-02-12 DIAGNOSIS — I10 Essential (primary) hypertension: Secondary | ICD-10-CM | POA: Diagnosis not present

## 2016-02-12 DIAGNOSIS — Z6837 Body mass index (BMI) 37.0-37.9, adult: Secondary | ICD-10-CM | POA: Insufficient documentation

## 2016-02-12 DIAGNOSIS — R0789 Other chest pain: Secondary | ICD-10-CM | POA: Diagnosis not present

## 2016-02-12 DIAGNOSIS — I340 Nonrheumatic mitral (valve) insufficiency: Secondary | ICD-10-CM | POA: Insufficient documentation

## 2016-02-12 DIAGNOSIS — R079 Chest pain, unspecified: Secondary | ICD-10-CM | POA: Insufficient documentation

## 2016-02-12 DIAGNOSIS — E119 Type 2 diabetes mellitus without complications: Secondary | ICD-10-CM | POA: Diagnosis not present

## 2016-02-12 DIAGNOSIS — E669 Obesity, unspecified: Secondary | ICD-10-CM | POA: Insufficient documentation

## 2016-02-12 LAB — MYOCARDIAL PERFUSION IMAGING
CHL CUP MPHR: 157 {beats}/min
CHL CUP NUCLEAR SRS: 0
CHL CUP NUCLEAR SSS: 0
CSEPHR: 89 %
Estimated workload: 7 METS
Exercise duration (min): 6 min
Exercise duration (sec): 0 s
LV sys vol: 24 mL
LVDIAVOL: 79 mL
NUC STRESS TID: 1.07
Peak HR: 139 {beats}/min
RATE: 0.32
RPE: 18
Rest HR: 61 {beats}/min
SDS: 0

## 2016-02-12 MED ORDER — TECHNETIUM TC 99M SESTAMIBI GENERIC - CARDIOLITE
32.9000 | Freq: Once | INTRAVENOUS | Status: AC | PRN
  Administered 2016-02-12: 32.9 via INTRAVENOUS

## 2016-02-12 MED ORDER — TECHNETIUM TC 99M SESTAMIBI GENERIC - CARDIOLITE
10.3000 | Freq: Once | INTRAVENOUS | Status: AC | PRN
  Administered 2016-02-12: 10 via INTRAVENOUS

## 2016-02-14 NOTE — H&P (Signed)
65yo postmenopausal female who presents hysteroscopy, D&C, myosure polypectomy due to PMB and uterine polyp. In review, the patient reported bleeding starting back in November 2016. It happens intermittently and will last for a day or so. Bleeding is bright red to pink and noticeable when she wipes. Not sure what seems to make it better or worse- occurs spontaneously. She denies pelvic or abdominal pain. No abdominal bloating. No vaginal discharge, itching or irritation. No other acute complaints. Work up was completed including an EMB that was negative and [place] which showed: 8cm retroverted uterus, 2.5cm endometrium with a 2.8cm cystic mass.   Current Medications  Taking   Simvastatin 20 MG Tablet 1 tablet once a day for cholesterol   Aspirin EC 81 MG Tablet Delayed Release 1 tablet Once a day   Vitamin D (Ergocalciferol) 50000 UNIT Capsule TAKE 1 CAPSULE BY MOUTH ONCE A WEEK   Multi-Vitamin Tablet 1 tablet Once a day   Clobetasol Propionate [id number] % Cream 1 application to affected area Twice a day as needed, Notes: prn   Calcium 600+D(Calcium-Vitamin D) 600-400 MG-UNIT Tablet 1 tablet with food Once a day   One Touch Ultra II Glucometer . Glucometer for use when checking blood sugars As directed   One Touch Ultra II Lancets . Lancets for use when checking blood sugars As directed   One Touch Ultra II test strips . strips use to check the patient's cbg q am, alternating between am and pm meals, ac   Tylenol 1 tab   Mobic(Meloxicam) 15 MG Tablet 1 tablet Once a day   Metoprolol Tartrate 50 MG Tablet 1 tablet Twice a day for BP   Losartan Potassium 100 MG Tablet 1 tablet once a day for blood pressure   Hydrochlorothiazide 25 MG Tablet 1 tablet once a day for blood pressure   Medication List reviewed and reconciled with the patient    Past Medical History  Hypertension  Hypercholesterolemia  Vitamin D deficiency (2009)  Diabetes, type 2 (02/2013)  Psoriasis   Surgical History  C-Section x 2  [id number]  Bilateral Tubal Ligation 1983  Cholecystectomy 1992  D&C (PMB) 02/2012  colonoscopy 10/24/2009  cyst removal off back   moles removed    Family History  Father: deceased, adenomatous colon polyp adenomatous colon polyp  Mother: deceased, adenomatous colon polyp; stomach cancer adenomatous colon polyp; stomach cancer  Paternal [place] Father: deceased  Paternal [place] Mother: deceased  Maternal [place] Father: deceased, diagnosed with Colon Ca  Maternal Grand Mother: deceased  Brother 1: esophageal cancer  Sister 1: deceased, alzheimer alzheimer  Daughter(s): alive  Son(s): alive  NEG GI FAMILY HX of liver disease 4 brothers, one sister-deceased.   Social History  General:  Tobacco use  cigarettes: Never smoked Tobacco history last updated 02/09/2016 no Alcohol.  no Recreational drug use.    Allergies  Macrodantin: Feels bad: Side Effects  Triamterene-HCTZ: GI upset: Side Effects  Accupril: Cough: Side Effects  Lisinopril: cough    Hospitalization/Major Diagnostic Procedure  childbirth x 2 c-section   Cholecystomy    Review of Systems  CONSTITUTIONAL:  no Chills. no Fever. no Night sweats.  HEENT:  Blurrred vision no. no Double vision.  CARDIOLOGY:  no Chest pain.  RESPIRATORY:  no Shortness of breath. no Cough.  UROLOGY:  no Urinary frequency. no Urinary incontinence. no Urinary urgency.  GASTROENTEROLOGY:  no Abdominal pain. no Appetite change. no Change in bowel movements.  FEMALE REPRODUCTIVE:  no Breast lumps or discharge. no Breast pain.  NEUROLOGY:  no Dizziness. no Headache. no Loss of consciousness.  PSYCHOLOGY:  no Depression. no Confusion.  SKIN:  no Rash. no Hives.  HEMATOLOGY/LYMPH:  no Anemia. no Fatigue. Using Blood Thinners no.     Performed in office on 02/09/16: Vital Signs  Wt 218, Wt change -6 lb, Ht 64.25, BMI 37.12, Pulse sitting 60, BP sitting 144/76.   Examination  General Examination: GENERAL APPEARANCE well developed,  well nourished.  SKIN: warm and dry, no rashes.  NECK: supple, normal appearance.  LUNGS: clear to auscultation bilaterally, no wheezes, rhonchi, rales.  HEART: no murmurs, regular rate and rhythm.  ABDOMEN: soft and not tender, no rebound, no rigidity.  MUSCULOSKELETAL no calf tenderness bilaterally.  EXTREMITIES: no edema present.  PSYCH appropriate mood and affect.     A/P: 65yo postmenopausal female who presents for hysteroscopy, D&C, myosure polypectomy due to PMB and uterine polyp -NPO -LR @ 125cc/hr -SCDs to OR -antibiotics not indicated -Risk/benefit and indication reviewed with patient. Question and concerns were addressed and pt agrees to proceed with above.  Katherine Pupa, DO [phone] (pager) [phone] (office)

## 2016-02-19 MED FILL — METOPROLOL TARTRATE 50 MG T: 50 | 90 days supply | Qty: 180 | Fill #2

## 2016-02-20 ENCOUNTER — Encounter (HOSPITAL_COMMUNITY)
Admission: RE | Admit: 2016-02-20 | Discharge: 2016-02-20 | Disposition: A | Discharge status: Home / Self Care | Payer: 59 | Source: Ambulatory Visit | Attending: Obstetrics & Gynecology | Admitting: Obstetrics & Gynecology

## 2016-02-20 ENCOUNTER — Encounter (HOSPITAL_COMMUNITY): Payer: Self-pay

## 2016-02-20 DIAGNOSIS — E785 Hyperlipidemia, unspecified: Secondary | ICD-10-CM | POA: Insufficient documentation

## 2016-02-20 DIAGNOSIS — E119 Type 2 diabetes mellitus without complications: Secondary | ICD-10-CM | POA: Insufficient documentation

## 2016-02-20 DIAGNOSIS — I10 Essential (primary) hypertension: Secondary | ICD-10-CM | POA: Insufficient documentation

## 2016-02-20 DIAGNOSIS — Z01812 Encounter for preprocedural laboratory examination: Secondary | ICD-10-CM | POA: Diagnosis not present

## 2016-02-20 HISTORY — DX: Hyperlipidemia, unspecified: E78.5

## 2016-02-20 LAB — COMPREHENSIVE METABOLIC PANEL
ALK PHOS: 79 U/L (ref 38–126)
ALT: 29 U/L (ref 14–54)
AST: 29 U/L (ref 15–41)
Albumin: 4 g/dL (ref 3.5–5.0)
Anion gap: 10 (ref 5–15)
BUN: 18 mg/dL (ref 6–20)
CALCIUM: 8.8 mg/dL — AB (ref 8.9–10.3)
CO2: 26 mmol/L (ref 22–32)
CREATININE: 0.84 mg/dL (ref 0.44–1.00)
Chloride: 103 mmol/L (ref 101–111)
Glucose, Bld: 170 mg/dL — ABNORMAL HIGH (ref 65–99)
Potassium: 3.4 mmol/L — ABNORMAL LOW (ref 3.5–5.1)
SODIUM: 139 mmol/L (ref 135–145)
Total Bilirubin: 0.6 mg/dL (ref 0.3–1.2)
Total Protein: 7.8 g/dL (ref 6.5–8.1)

## 2016-02-20 LAB — CBC
HCT: 42.1 % (ref 36.0–46.0)
HEMOGLOBIN: 14.4 g/dL (ref 12.0–15.0)
MCH: 30 pg (ref 26.0–34.0)
MCHC: 34.2 g/dL (ref 30.0–36.0)
MCV: 87.7 fL (ref 78.0–100.0)
PLATELETS: 257 10*3/uL (ref 150–400)
RBC: 4.8 MIL/uL (ref 3.87–5.11)
RDW: 13.2 % (ref 11.5–15.5)
WBC: 10.4 10*3/uL (ref 4.0–10.5)

## 2016-02-20 LAB — TYPE AND SCREEN
ABO/RH(D): O POS
ANTIBODY SCREEN: NEGATIVE

## 2016-02-20 LAB — ABO/RH: ABO/RH(D): O POS

## 2016-02-20 NOTE — Patient Instructions (Addendum)
Your procedure is scheduled on:  Enter through the Main Entrance of [facility] at:  11:25am  Pick up the phone at the desk and dial [phone].  Call this number if you have problems the morning of surgery: [phone].  Remember: Do NOT eat food: after midnight Tuesday Do NOT drink clear liquids: til 11am Wednesday, day of surgery. Take these medicines the morning of surgery with a SIP OF WATER: losartan, hctz, metoprolol  Do NOT wear jewelry (body piercing), metal hair clips/bobby pins, make-up, or nail polish. Do NOT wear lotions, powders, or perfumes.  You may wear deoderant. Do NOT shave for 48 hours prior to surgery. Do NOT bring valuables to the hospital. Contacts, dentures, or bridgework may not be worn into surgery.  Have a responsible adult drive you home and stay with you for 24 hours after your procedure.  Home with friend Jeani Hawking cell [phone]

## 2016-02-28 ENCOUNTER — Ambulatory Visit (HOSPITAL_COMMUNITY): Payer: 59 | Admitting: Anesthesiology

## 2016-02-28 ENCOUNTER — Encounter (HOSPITAL_COMMUNITY)
Admission: RE | Disposition: A | Discharge status: Home / Self Care | Payer: Self-pay | Source: Ambulatory Visit | Attending: Obstetrics & Gynecology

## 2016-02-28 ENCOUNTER — Encounter (HOSPITAL_COMMUNITY): Payer: Self-pay | Admitting: Anesthesiology

## 2016-02-28 ENCOUNTER — Ambulatory Visit (HOSPITAL_COMMUNITY)
Admission: RE | Admit: 2016-02-28 | Discharge: 2016-02-28 | Disposition: A | Discharge status: Home / Self Care | Payer: 59 | Source: Ambulatory Visit | Attending: Obstetrics & Gynecology | Admitting: Obstetrics & Gynecology

## 2016-02-28 DIAGNOSIS — N840 Polyp of corpus uteri: Secondary | ICD-10-CM | POA: Diagnosis not present

## 2016-02-28 DIAGNOSIS — Z780 Asymptomatic menopausal state: Secondary | ICD-10-CM | POA: Insufficient documentation

## 2016-02-28 DIAGNOSIS — I10 Essential (primary) hypertension: Secondary | ICD-10-CM | POA: Diagnosis not present

## 2016-02-28 DIAGNOSIS — E559 Vitamin D deficiency, unspecified: Secondary | ICD-10-CM | POA: Insufficient documentation

## 2016-02-28 DIAGNOSIS — E7800 Pure hypercholesterolemia, unspecified: Secondary | ICD-10-CM | POA: Diagnosis not present

## 2016-02-28 DIAGNOSIS — Z7982 Long term (current) use of aspirin: Secondary | ICD-10-CM | POA: Diagnosis not present

## 2016-02-28 DIAGNOSIS — E119 Type 2 diabetes mellitus without complications: Secondary | ICD-10-CM | POA: Insufficient documentation

## 2016-02-28 DIAGNOSIS — N950 Postmenopausal bleeding: Secondary | ICD-10-CM | POA: Diagnosis not present

## 2016-02-28 DIAGNOSIS — L409 Psoriasis, unspecified: Secondary | ICD-10-CM | POA: Insufficient documentation

## 2016-02-28 DIAGNOSIS — E118 Type 2 diabetes mellitus with unspecified complications: Secondary | ICD-10-CM | POA: Diagnosis not present

## 2016-02-28 HISTORY — PX: DILATATION & CURETTAGE/HYSTEROSCOPY WITH MYOSURE: SHX6511

## 2016-02-28 SURGERY — DILATATION & CURETTAGE/HYSTEROSCOPY WITH MYOSURE
Anesthesia: General | Site: Vagina

## 2016-02-28 MED ORDER — ONDANSETRON HCL 4 MG/2ML IJ SOLN
INTRAMUSCULAR | Status: AC
  Filled 2016-02-28: qty 2

## 2016-02-28 MED ORDER — DEXAMETHASONE SODIUM PHOSPHATE 4 MG/ML IJ SOLN
INTRAMUSCULAR | Status: DC | PRN
  Administered 2016-02-28: 10 mg via INTRAVENOUS

## 2016-02-28 MED ORDER — LIDOCAINE HCL (CARDIAC) 20 MG/ML IV SOLN
INTRAVENOUS | Status: AC
  Filled 2016-02-28: qty 5

## 2016-02-28 MED ORDER — FENTANYL CITRATE (PF) 100 MCG/2ML IJ SOLN
INTRAMUSCULAR | Status: AC
  Filled 2016-02-28: qty 2

## 2016-02-28 MED ORDER — LIDOCAINE HCL (CARDIAC) 20 MG/ML IV SOLN
INTRAVENOUS | Status: DC | PRN
  Administered 2016-02-28: 100 mg via INTRAVENOUS

## 2016-02-28 MED ORDER — ONDANSETRON HCL 4 MG/2ML IJ SOLN
INTRAMUSCULAR | Status: DC | PRN
  Administered 2016-02-28: 4 mg via INTRAVENOUS

## 2016-02-28 MED ORDER — FENTANYL CITRATE (PF) 100 MCG/2ML IJ SOLN: 25.0000 ug | INTRAMUSCULAR | Status: DC | PRN

## 2016-02-28 MED ORDER — KETOROLAC TROMETHAMINE 30 MG/ML IJ SOLN
INTRAMUSCULAR | Status: AC
  Filled 2016-02-28: qty 1

## 2016-02-28 MED ORDER — FENTANYL CITRATE (PF) 100 MCG/2ML IJ SOLN
INTRAMUSCULAR | Status: DC | PRN
  Administered 2016-02-28 (×2): 50 ug via INTRAVENOUS

## 2016-02-28 MED ORDER — KETOROLAC TROMETHAMINE 30 MG/ML IJ SOLN
INTRAMUSCULAR | Status: DC | PRN
Start: 2016-02-28 — End: 2016-02-28
  Administered 2016-02-28: 30 mg via INTRAVENOUS

## 2016-02-28 MED ORDER — SCOPOLAMINE 1 MG/3DAYS TD PT72: 1.0000 | MEDICATED_PATCH | Freq: Once | TRANSDERMAL | Status: DC

## 2016-02-28 MED ORDER — DEXAMETHASONE SODIUM PHOSPHATE 10 MG/ML IJ SOLN
INTRAMUSCULAR | Status: AC
  Filled 2016-02-28: qty 1

## 2016-02-28 MED ORDER — LACTATED RINGERS IV SOLN
INTRAVENOUS | Status: DC
Start: 2016-02-28 — End: 2016-02-28
  Administered 2016-02-28: 13:00:00 via INTRAVENOUS

## 2016-02-28 MED ORDER — MIDAZOLAM HCL 2 MG/2ML IJ SOLN
INTRAMUSCULAR | Status: AC
  Filled 2016-02-28: qty 2

## 2016-02-28 MED ORDER — PROPOFOL 10 MG/ML IV BOLUS
INTRAVENOUS | Status: AC
  Filled 2016-02-28: qty 40

## 2016-02-28 MED ORDER — PROPOFOL 10 MG/ML IV BOLUS
INTRAVENOUS | Status: DC | PRN
  Administered 2016-02-28: 200 mg via INTRAVENOUS

## 2016-02-28 MED ORDER — ONDANSETRON HCL 4 MG/2ML IJ SOLN: 4.0000 mg | Freq: Once | INTRAMUSCULAR | Status: DC | PRN

## 2016-02-28 MED ORDER — LACTATED RINGERS IV SOLN
INTRAVENOUS | Status: DC
  Administered 2016-02-28 (×2): via INTRAVENOUS

## 2016-02-28 MED ORDER — MIDAZOLAM HCL 5 MG/5ML IJ SOLN
INTRAMUSCULAR | Status: DC | PRN
  Administered 2016-02-28: 2 mg via INTRAVENOUS

## 2016-02-28 SURGICAL SUPPLY — 17 items
CANISTERS HI-FLOW 3000CC (CANNISTER) ×2 IMPLANT
CATH ROBINSON RED A/P 16FR (CATHETERS) ×2 IMPLANT
CLOTH BEACON ORANGE TIMEOUT ST (SAFETY) ×2 IMPLANT
CONTAINER PREFILL 10% NBF 60ML (FORM) ×4 IMPLANT
DEVICE MYOSURE LITE (MISCELLANEOUS) ×1 IMPLANT
DEVICE MYOSURE REACH (MISCELLANEOUS) IMPLANT
DILATOR CANAL MILEX (MISCELLANEOUS) IMPLANT
GLOVE BIOGEL PI IND STRL 6.5 (GLOVE) ×2 IMPLANT
GLOVE BIOGEL PI IND STRL 7.0 (GLOVE) ×1 IMPLANT
GLOVE BIOGEL PI INDICATOR 6.5 (GLOVE) ×2
GLOVE BIOGEL PI INDICATOR 7.0 (GLOVE) ×1
GLOVE ECLIPSE 6.5 STRL STRAW (GLOVE) ×2 IMPLANT
GOWN STRL REUS W/TWL LRG LVL3 (GOWN DISPOSABLE) ×4 IMPLANT
PACK VAGINAL MINOR WOMEN LF (CUSTOM PROCEDURE TRAY) ×2 IMPLANT
PAD OB MATERNITY 4.3X12.25 (PERSONAL CARE ITEMS) ×2 IMPLANT
SEAL ROD LENS SCOPE MYOSURE (ABLATOR) ×2 IMPLANT
TOWEL OR 17X24 6PK STRL BLUE (TOWEL DISPOSABLE) ×4 IMPLANT

## 2016-02-28 NOTE — Anesthesia Preprocedure Evaluation (Signed)
Anesthesia Evaluation  Patient identified by MRN, date of birth, ID band Patient awake    Reviewed: Allergy & Precautions, NPO status , Patient's Chart, lab work & pertinent test results  History of Anesthesia Complications Negative for: history of anesthetic complications  Airway Mallampati: II  TM Distance: >3 FB Neck ROM: Full    Dental no notable dental hx. (+) Dental Advisory Given   Pulmonary neg pulmonary ROS,    Pulmonary exam normal breath sounds clear to auscultation       Cardiovascular hypertension, Pt. on medications Normal cardiovascular exam Rhythm:Regular Rate:Normal     Neuro/Psych negative neurological ROS  negative psych ROS   GI/Hepatic negative GI ROS, Neg liver ROS,   Endo/Other  diabetes  Renal/GU negative Renal ROS  negative genitourinary   Musculoskeletal negative musculoskeletal ROS (+)   Abdominal   Peds negative pediatric ROS (+)  Hematology negative hematology ROS (+)   Anesthesia Other Findings   Reproductive/Obstetrics negative OB ROS                             Anesthesia Physical Anesthesia Plan  ASA: II  Anesthesia Plan: General   Post-op Pain Management:    Induction: Intravenous  Airway Management Planned: LMA  Additional Equipment:   Intra-op Plan:   Post-operative Plan: Extubation in OR  Informed Consent: I have reviewed the patients History and Physical, chart, labs and discussed the procedure including the risks, benefits and alternatives for the proposed anesthesia with the patient or authorized representative who has indicated his/her understanding and acceptance.   Dental advisory given  Plan Discussed with: CRNA  Anesthesia Plan Comments:         Anesthesia Quick Evaluation

## 2016-02-28 NOTE — Anesthesia Postprocedure Evaluation (Signed)
Anesthesia Post Note  Patient: Katherine Frederick  Procedure(s) Performed: Procedure(s) (LRB): DILATATION & CURETTAGE/HYSTEROSCOPY WITH MYOSURE (N/A)  Patient location during evaluation: PACU Level of consciousness: awake Pain management: pain level controlled Vital Signs Assessment: post-procedure vital signs reviewed and stable Respiratory status: spontaneous breathing Cardiovascular status: blood pressure returned to baseline and stable Postop Assessment: no signs of nausea or vomiting and adequate PO intake Anesthetic complications: no    Last Vitals:  Filed Vitals:   02/28/16 1630 02/28/16 1645  BP: 113/60 110/60  Pulse: 51 54  Temp:  36.5 C  Resp: 14 16    Last Pain: There were no vitals filed for this visit.               [place]

## 2016-02-28 NOTE — Discharge Instructions (Addendum)
HOME INSTRUCTIONS  Please note any unusual or excessive bleeding, pain, swelling. Mild dizziness or drowsiness are normal for about 24 hours after surgery.   Shower when comfortable  Restrictions: No driving for 24 hours or while taking pain medications.  Activity:  No heavy lifting (> 40 lbs), nothing in vagina (no tampons, douching, or intercourse) x 2 weeks; no tub baths for 2 weeks Vaginal spotting is expected but if your bleeding is heavy, period like,  please call the office  Diet:  You may return to your regular diet.  Do not eat large meals.  Eat small frequent meals throughout the day.  Continue to drink a good amount of water at least 6-8 glasses of water per day, hydration is very important for the healing process.  Pain Management: Take Motrin and/or tylenol as needed for pain.  Always take prescription pain medication with food, it may cause constipation, increase fluids and fiber and you may want to take an over-the-counter stool softener like Colace as needed up to 2x a day.    Alcohol -- Avoid for 24 hours and while taking pain medications.  Nausea: Take sips of ginger ale or soda  Fever -- Call physician if temperature over 101 degrees  Follow up:  If you do not already have a follow up appointment scheduled, please call the office at [phone].  If you experience fever (a temperature greater than 100.4), pain unrelieved by pain medication, shortness of breath, swelling of a single leg, or any other symptoms which are concerning to you please the office immediately.  No Ibuprofen products until after 9:30 PM tonight.

## 2016-02-28 NOTE — Transfer of Care (Signed)
Immediate Anesthesia Transfer of Care Note  Patient: Katherine Frederick  Procedure(s) Performed: Procedure(s) with comments: DILATATION & CURETTAGE/HYSTEROSCOPY WITH MYOSURE (N/A) - Polypectomy  Patient Location: PACU  Anesthesia Type:General  Level of Consciousness: awake, alert  and oriented  Airway & Oxygen Therapy: Patient Spontanous Breathing and Patient connected to nasal cannula oxygen  Post-op Assessment: Report given to RN and Post -op Vital signs reviewed and stable  Post vital signs: Reviewed and stable  Last Vitals:  Filed Vitals:   02/28/16 1226  BP: 126/86  Pulse: 58  Temp: 36.3 C  Resp: 20    Complications: No apparent anesthesia complications

## 2016-02-28 NOTE — Anesthesia Procedure Notes (Signed)
Procedure Name: LMA Insertion Date/Time: 02/28/2016 3:26 PM Performed by: Riki Sheer Pre-anesthesia Checklist: Patient identified, Emergency Drugs available, Suction available, Patient being monitored and Timeout performed Patient Re-evaluated:Patient Re-evaluated prior to inductionOxygen Delivery Method: Circle system utilized Preoxygenation: Pre-oxygenation with 100% oxygen Intubation Type: IV induction Ventilation: Mask ventilation without difficulty LMA: LMA inserted LMA Size: 4.0 Placement Confirmation: positive ETCO2,  CO2 detector and breath sounds checked- equal and bilateral Tube secured with: Tape Dental Injury: Teeth and Oropharynx as per pre-operative assessment

## 2016-02-28 NOTE — Op Note (Signed)
OP NOTE  PreOp: Uterine polyp PostOp: same Procedure: Hysteroscopy, Dilation and Curettage, Myosure polypectomy Surgeon: Dr. Janyth Pupa Anesthesia: General Complications:none EBL: [id number] UOP: 10cc IVF:1500cc  Findings: 8cm anteverted uterus with proliferative endometrium, two separate 70mm polyps seen. No other lesions appreciated, both ostia visualized  Specimens: 1) Endometrial curettings with uterine polyp  Procedure: The patient was taken to the operating room where she underwent general anesthesia without difficulty. The patient was placed in a low lithotomy position using Allen stirrups. The patient was examined with the findings as noted above. She was then prepped and draped in the normal sterile fashion. The bladder was drained using a red rubber urethral catheter. A sterile speculum was inserted into the vagina. A single tooth tenaculum was placed on the anterior lip of the cervix. 10cc of Marcaine was used to perform a cervical block. The uterus was then sounded to 6cm. The endocervical canal was then serially dilated to 16French using Hank dilators. The diagnostic hysteroscope was then inserted without difficulty and noted to have the findings as listed above. Myosure light was then used for resection of the polyp. The tissue was sent to pathology. The hysteroscope was removed and sharp curettage was performed and tissue sent to pathology. The hysteroscope was reinserted-, no uterine perforation. All instrument were then removed. Hemostasis was observed at the cervical site. The patient was repositioned to the supine position. The patient tolerated the procedure without any complications and taken to recovery in stable condition.   Janyth Pupa, DO [phone] (pager) [phone] (office)

## 2016-02-29 ENCOUNTER — Encounter (HOSPITAL_COMMUNITY): Payer: Self-pay | Admitting: Obstetrics & Gynecology

## 2016-03-13 DIAGNOSIS — E7800 Pure hypercholesterolemia, unspecified: Secondary | ICD-10-CM | POA: Diagnosis not present

## 2016-03-13 DIAGNOSIS — E559 Vitamin D deficiency, unspecified: Secondary | ICD-10-CM | POA: Diagnosis not present

## 2016-03-13 DIAGNOSIS — E119 Type 2 diabetes mellitus without complications: Secondary | ICD-10-CM | POA: Diagnosis not present

## 2016-03-13 DIAGNOSIS — I10 Essential (primary) hypertension: Secondary | ICD-10-CM | POA: Diagnosis not present

## 2016-03-13 MED FILL — MELOXICAM 15 MG TABLET: 15 | 20 days supply | Qty: 20 | Fill #1

## 2016-04-17 MED FILL — VIT D2 1.25 MG (50,000 UNIT: 1.25 MG | 84 days supply | Qty: 12 | Fill #2

## 2016-04-28 MED FILL — HYDROCHLOROTHIAZIDE 25 MG T: 25 | 90 days supply | Qty: 90 | Fill #1

## 2016-04-29 MED FILL — LOSARTAN POTASSIUM 100 MG T: 100 | 90 days supply | Qty: 90 | Fill #1

## 2016-04-29 MED FILL — SIMVASTATIN 20 MG TABLET: 20 | 90 days supply | Qty: 90 | Fill #0

## 2016-05-15 MED FILL — METOPROLOL TARTRATE 50 MG T: 50 | 90 days supply | Qty: 180 | Fill #3

## 2016-07-09 MED FILL — VIT D2 1.25 MG (50,000 UNIT: 1.25 MG | 84 days supply | Qty: 12 | Fill #3

## 2016-07-29 MED FILL — SIMVASTATIN 20 MG TABLET: 20 | 90 days supply | Qty: 90 | Fill #1

## 2016-07-29 MED FILL — HYDROCHLOROTHIAZIDE 25 MG T: 25 | 90 days supply | Qty: 90 | Fill #2

## 2016-07-29 MED FILL — LOSARTAN POTASSIUM 100 MG T: 100 | 90 days supply | Qty: 90 | Fill #2

## 2016-08-13 MED FILL — METOPROLOL TARTRATE 50 MG T: 50 | 90 days supply | Qty: 180 | Fill #0

## 2016-08-14 ENCOUNTER — Other Ambulatory Visit: Payer: Self-pay | Admitting: Family Medicine

## 2016-08-14 DIAGNOSIS — Z1231 Encounter for screening mammogram for malignant neoplasm of breast: Secondary | ICD-10-CM

## 2016-09-12 ENCOUNTER — Ambulatory Visit
Admission: RE | Admit: 2016-09-12 | Discharge: 2016-09-12 | Disposition: A | Discharge status: Home / Self Care | Payer: 59 | Source: Ambulatory Visit | Attending: Family Medicine | Admitting: Family Medicine

## 2016-09-12 DIAGNOSIS — E7800 Pure hypercholesterolemia, unspecified: Secondary | ICD-10-CM | POA: Diagnosis not present

## 2016-09-12 DIAGNOSIS — Z1231 Encounter for screening mammogram for malignant neoplasm of breast: Secondary | ICD-10-CM | POA: Diagnosis not present

## 2016-09-12 DIAGNOSIS — I10 Essential (primary) hypertension: Secondary | ICD-10-CM | POA: Diagnosis not present

## 2016-09-12 DIAGNOSIS — E119 Type 2 diabetes mellitus without complications: Secondary | ICD-10-CM | POA: Diagnosis not present

## 2016-09-12 DIAGNOSIS — E559 Vitamin D deficiency, unspecified: Secondary | ICD-10-CM | POA: Diagnosis not present

## 2016-09-24 MED FILL — VIT D2 1.25 MG (50,000 UNIT: 1.25 MG | 84 days supply | Qty: 12 | Fill #0

## 2016-09-25 DIAGNOSIS — H5213 Myopia, bilateral: Secondary | ICD-10-CM | POA: Diagnosis not present

## 2016-09-25 DIAGNOSIS — H524 Presbyopia: Secondary | ICD-10-CM | POA: Diagnosis not present

## 2016-09-25 DIAGNOSIS — H52203 Unspecified astigmatism, bilateral: Secondary | ICD-10-CM | POA: Diagnosis not present

## 2016-10-21 MED FILL — SIMVASTATIN 20 MG TABLET: 20 | 90 days supply | Qty: 90 | Fill #0

## 2016-10-22 DIAGNOSIS — Z01419 Encounter for gynecological examination (general) (routine) without abnormal findings: Secondary | ICD-10-CM | POA: Diagnosis not present

## 2016-10-27 MED FILL — LOSARTAN POTASSIUM 100 MG T: 100 | 90 days supply | Qty: 90 | Fill #3

## 2016-10-27 MED FILL — HYDROCHLOROTHIAZIDE 25 MG T: 25 | 90 days supply | Qty: 90 | Fill #3

## 2016-11-12 MED FILL — METOPROLOL TARTRATE 50 MG T: 50 | 90 days supply | Qty: 180 | Fill #0

## 2016-12-24 MED FILL — VIT D2 1.25 MG (50,000 UNIT: 1.25 MG | 84 days supply | Qty: 12 | Fill #1

## 2017-01-20 MED FILL — SIMVASTATIN 20 MG TABLET: 20 | 90 days supply | Qty: 90 | Fill #1

## 2017-02-03 MED FILL — HYDROCHLOROTHIAZIDE 25 MG T: 25 | 90 days supply | Qty: 90 | Fill #0

## 2017-02-03 MED FILL — LOSARTAN POTASSIUM 100 MG T: 100 | 90 days supply | Qty: 90 | Fill #0

## 2017-02-10 MED FILL — METOPROLOL TARTRATE 50 MG T: 50 | 90 days supply | Qty: 180 | Fill #1

## 2017-03-15 MED FILL — VIT D2 1.25 MG (50,000 UNIT: 1.25 MG | 84 days supply | Qty: 12 | Fill #2

## 2017-03-31 DIAGNOSIS — E119 Type 2 diabetes mellitus without complications: Secondary | ICD-10-CM | POA: Diagnosis not present

## 2017-03-31 DIAGNOSIS — I10 Essential (primary) hypertension: Secondary | ICD-10-CM | POA: Diagnosis not present

## 2017-03-31 DIAGNOSIS — E559 Vitamin D deficiency, unspecified: Secondary | ICD-10-CM | POA: Diagnosis not present

## 2017-03-31 DIAGNOSIS — E7800 Pure hypercholesterolemia, unspecified: Secondary | ICD-10-CM | POA: Diagnosis not present

## 2017-03-31 MED FILL — ACCU-CHEK GUIDE TEST STRIP: 90 days supply | Qty: 100 | Fill #0

## 2017-03-31 MED FILL — ACCU-CHEK FASTCLIX LANCETS: 90 days supply | Qty: 102 | Fill #0

## 2017-04-10 DIAGNOSIS — Z23 Encounter for immunization: Secondary | ICD-10-CM | POA: Diagnosis not present

## 2017-04-20 MED FILL — SIMVASTATIN 20 MG TABLET: 20 | 90 days supply | Qty: 90 | Fill #2

## 2017-04-24 DIAGNOSIS — E876 Hypokalemia: Secondary | ICD-10-CM | POA: Diagnosis not present

## 2017-05-11 MED FILL — METOPROLOL TARTRATE 50 MG T: 50 | 90 days supply | Qty: 180 | Fill #2

## 2017-05-12 MED FILL — LOSARTAN POTASSIUM 100 MG T: 100 | 90 days supply | Qty: 90 | Fill #1

## 2017-06-09 MED FILL — VIT D2 1.25 MG (50,000 UNIT: 1.25 MG | 84 days supply | Qty: 12 | Fill #3

## 2017-06-09 MED FILL — HYDROCHLOROTHIAZIDE 25 MG T: 25 | 90 days supply | Qty: 90 | Fill #1

## 2017-07-07 MED FILL — ACCU-CHEK GUIDE TEST STRIP: 90 days supply | Qty: 100 | Fill #1

## 2017-07-07 MED FILL — ACCU-CHEK FASTCLIX LANCETS: 90 days supply | Qty: 102 | Fill #1

## 2017-07-28 MED FILL — SIMVASTATIN 20 MG TABLET: 20 | 90 days supply | Qty: 90 | Fill #3

## 2017-08-06 MED FILL — LOSARTAN POTASSIUM 100 MG T: 100 | 90 days supply | Qty: 90 | Fill #2

## 2017-08-12 MED FILL — METOPROLOL TARTRATE 50 MG T: 50 | 90 days supply | Qty: 180 | Fill #3

## 2017-09-01 MED FILL — VIT D2 1.25 MG (50,000 UNIT: 1.25 MG | 84 days supply | Qty: 12 | Fill #0

## 2017-09-10 ENCOUNTER — Other Ambulatory Visit: Payer: Self-pay | Admitting: Family Medicine

## 2017-09-10 DIAGNOSIS — Z1231 Encounter for screening mammogram for malignant neoplasm of breast: Secondary | ICD-10-CM

## 2017-09-23 ENCOUNTER — Ambulatory Visit
Admission: RE | Admit: 2017-09-23 | Discharge: 2017-09-23 | Disposition: A | Discharge status: Home / Self Care | Payer: 59 | Source: Ambulatory Visit | Attending: Family Medicine | Admitting: Family Medicine

## 2017-09-23 DIAGNOSIS — Z1231 Encounter for screening mammogram for malignant neoplasm of breast: Secondary | ICD-10-CM

## 2017-09-30 DIAGNOSIS — Z23 Encounter for immunization: Secondary | ICD-10-CM | POA: Diagnosis not present

## 2017-09-30 DIAGNOSIS — Z0000 Encounter for general adult medical examination without abnormal findings: Secondary | ICD-10-CM | POA: Diagnosis not present

## 2017-09-30 DIAGNOSIS — I10 Essential (primary) hypertension: Secondary | ICD-10-CM | POA: Diagnosis not present

## 2017-09-30 DIAGNOSIS — E1165 Type 2 diabetes mellitus with hyperglycemia: Secondary | ICD-10-CM | POA: Diagnosis not present

## 2017-09-30 DIAGNOSIS — E119 Type 2 diabetes mellitus without complications: Secondary | ICD-10-CM | POA: Diagnosis not present

## 2017-09-30 DIAGNOSIS — E7800 Pure hypercholesterolemia, unspecified: Secondary | ICD-10-CM | POA: Diagnosis not present

## 2017-09-30 DIAGNOSIS — E559 Vitamin D deficiency, unspecified: Secondary | ICD-10-CM | POA: Diagnosis not present

## 2017-10-17 MED FILL — ACCU-CHEK FASTCLIX LANCETS: 90 days supply | Qty: 102 | Fill #2 | Status: TO

## 2017-10-17 MED FILL — ACCU-CHEK GUIDE TEST STRIP: 90 days supply | Qty: 100 | Fill #2 | Status: TO

## 2017-10-17 MED FILL — SIMVASTATIN 20 MG TABLET: 20 | 90 days supply | Qty: 90 | Fill #0

## 2017-10-23 ENCOUNTER — Other Ambulatory Visit: Payer: Self-pay | Admitting: Nurse Practitioner

## 2017-10-23 ENCOUNTER — Other Ambulatory Visit (HOSPITAL_COMMUNITY)
Admission: RE | Admit: 2017-10-23 | Discharge: 2017-10-23 | Disposition: A | Discharge status: Home / Self Care | Payer: 59 | Source: Ambulatory Visit | Attending: Nurse Practitioner | Admitting: Nurse Practitioner

## 2017-10-23 DIAGNOSIS — Z01419 Encounter for gynecological examination (general) (routine) without abnormal findings: Secondary | ICD-10-CM | POA: Diagnosis not present

## 2017-10-24 LAB — CYTOLOGY - PAP
Diagnosis: NEGATIVE
HPV: NOT DETECTED

## 2017-11-03 MED FILL — METOPROLOL TARTRATE 50 MG T: 50 | 90 days supply | Qty: 180 | Fill #0

## 2017-11-03 MED FILL — LOSARTAN POTASSIUM 100 MG T: 100 | 90 days supply | Qty: 90 | Fill #0

## 2017-11-25 MED FILL — VIT D2 1.25 MG (50,000 UNIT: 1.25 MG | 84 days supply | Qty: 12 | Fill #0 | Status: TO

## 2017-12-08 MED FILL — HYDROCHLOROTHIAZIDE 25 MG T: 25 | 90 days supply | Qty: 90 | Fill #0 | Status: TO

## 2018-01-14 DIAGNOSIS — H524 Presbyopia: Secondary | ICD-10-CM | POA: Diagnosis not present

## 2018-01-14 DIAGNOSIS — H2513 Age-related nuclear cataract, bilateral: Secondary | ICD-10-CM | POA: Diagnosis not present

## 2018-01-14 DIAGNOSIS — H43813 Vitreous degeneration, bilateral: Secondary | ICD-10-CM | POA: Diagnosis not present

## 2018-01-14 DIAGNOSIS — E119 Type 2 diabetes mellitus without complications: Secondary | ICD-10-CM | POA: Diagnosis not present

## 2018-01-19 MED FILL — SIMVASTATIN 20 MG TABS: 20 | 90 days supply | Qty: 90 | Fill #1 | Status: TO

## 2018-01-26 MED FILL — LOSARTAN POTASSIUM 100 MG T: 100 | 90 days supply | Qty: 90 | Fill #1 | Status: TO

## 2018-02-03 MED FILL — METOPROLOL TARTRATE 50 MG T: 50 | 90 days supply | Qty: 180 | Fill #1 | Status: TO

## 2018-03-31 DIAGNOSIS — I10 Essential (primary) hypertension: Secondary | ICD-10-CM | POA: Diagnosis not present

## 2018-03-31 DIAGNOSIS — E119 Type 2 diabetes mellitus without complications: Secondary | ICD-10-CM | POA: Diagnosis not present

## 2018-03-31 DIAGNOSIS — E559 Vitamin D deficiency, unspecified: Secondary | ICD-10-CM | POA: Diagnosis not present

## 2018-03-31 DIAGNOSIS — E7800 Pure hypercholesterolemia, unspecified: Secondary | ICD-10-CM | POA: Diagnosis not present

## 2018-03-31 DIAGNOSIS — Z23 Encounter for immunization: Secondary | ICD-10-CM | POA: Diagnosis not present

## 2018-05-26 ENCOUNTER — Telehealth: Payer: Self-pay | Admitting: Pulmonary Disease

## 2018-05-26 DIAGNOSIS — L218 Other seborrheic dermatitis: Secondary | ICD-10-CM | POA: Diagnosis not present

## 2018-05-27 NOTE — Telephone Encounter (Signed)
Error

## 2018-05-28 DIAGNOSIS — N7689 Other specified inflammation of vagina and vulva: Secondary | ICD-10-CM | POA: Diagnosis not present

## 2018-05-28 DIAGNOSIS — N950 Postmenopausal bleeding: Secondary | ICD-10-CM | POA: Diagnosis not present

## 2018-06-10 DIAGNOSIS — M25562 Pain in left knee: Secondary | ICD-10-CM | POA: Diagnosis not present

## 2018-06-18 ENCOUNTER — Other Ambulatory Visit: Payer: Self-pay | Admitting: Nurse Practitioner

## 2018-06-18 DIAGNOSIS — C541 Malignant neoplasm of endometrium: Secondary | ICD-10-CM | POA: Diagnosis not present

## 2018-06-18 DIAGNOSIS — N9089 Other specified noninflammatory disorders of vulva and perineum: Secondary | ICD-10-CM | POA: Diagnosis not present

## 2018-06-18 DIAGNOSIS — N950 Postmenopausal bleeding: Secondary | ICD-10-CM | POA: Diagnosis not present

## 2018-06-26 ENCOUNTER — Telehealth: Payer: Self-pay

## 2018-06-26 NOTE — Telephone Encounter (Signed)
Pt requesting to see Dr. Denman George. Gave her an appointment for Friday 07-03-18 at 0930.  [place]ice between 083 and 0845 to register. Pt verbalized understanding.

## 2018-07-03 ENCOUNTER — Inpatient Hospital Stay: Payer: Medicare Other | Attending: Gynecologic Oncology | Admitting: Gynecologic Oncology

## 2018-07-03 ENCOUNTER — Encounter: Payer: Self-pay | Admitting: Gynecologic Oncology

## 2018-07-03 VITALS — BP 132/57 | HR 56 | Temp 98.5°F | Resp 20 | Ht 65.0 in | Wt 203.5 lb

## 2018-07-03 DIAGNOSIS — N9089 Other specified noninflammatory disorders of vulva and perineum: Secondary | ICD-10-CM | POA: Diagnosis not present

## 2018-07-03 DIAGNOSIS — C541 Malignant neoplasm of endometrium: Secondary | ICD-10-CM | POA: Diagnosis not present

## 2018-07-03 NOTE — H&P (View-Only) (Signed)
Consult Note: Gyn-Onc  Consult was requested by Dr. Debroah Baller, NP for the evaluation of Katherine Frederick 67 y.o. female  CC:  Chief Complaint  Patient presents with  . Adenocarcinoma [facility])    Assessment/Plan:  Katherine. Katherine Frederick  is a 67 y.o.  year old with grade 1 endometrial cancer.  A detailed discussion was held with the patient and her family with regard to to her endometrial cancer diagnosis. We discussed the standard management options for uterine cancer which includes surgery followed possibly by adjuvant therapy depending on the results of surgery. The options for surgical management include a hysterectomy and removal of the tubes and ovaries possibly with removal of pelvic and para-aortic lymph nodes.If feasible, a minimally invasive approach including a robotic hysterectomy or laparoscopic hysterectomy have benefits including shorter hospital stay, recovery time and better wound healing than with open surgery. The patient has been counseled about these surgical options and the risks of surgery in general including infection, bleeding, damage to surrounding structures (including bowel, bladder, ureters, nerves or vessels), and the postoperative risks of PE/ DVT, and lymphedema. I extensively reviewed the additional risks of robotic hysterectomy including possible need for conversion to open laparotomy.  I discussed positioning during surgery of trendelenberg and risks of minor facial swelling and care we take in preoperative positioning.  After counseling and consideration of her options, she desires to proceed with robotic assisted total hysterectomy with bilateral sapingo-oophorectomy and SLN biopsy.   She will be seen by anesthesia for preoperative clearance and discussion of postoperative pain management.  She was given the opportunity to ask questions, which were answered to her satisfaction, and she is agreement with the above mentioned plan of care.  She will hold her  ASA preoperatively.  Surgery has been scheduled for 07/15/18.  Vulvar lesion: lichen sclerosis vs VIN  - plan for vulvar biopsy at time of surgery.   HPI: Katherine Frederick is a very pleasant 67 year old P2 who is seen in consultation at the request of Dr Nelda Marseille and Delice Bison NP for grade 1 endometrioid endometrial cancer.  The patient has a history of postmenopausal bleeding since approximately 2011.  She has had intermittent biopsies and D&Cs which had always returned as benign polyps.  She had a new episode of bright red vaginal bleeding that was somewhat different in characteristic in June 2019.  This prompted her to be seen by Delice Bison who performed a transvaginal ultrasound scan on June 18, 2018 which revealed a uterus measuring 4.3 x 7.5 x 4.8 cm with an endometrial thickness of 1.6 cm.  The ovaries were grossly normal bilaterally.  An endometrial [place] biopsy was then performed on the same day which revealed endometrioid adenocarcinoma FIGO grade 1 arising the background of complex atypical hyperplasia.  Patient otherwise carries a medical history of diet-controlled prediabetes with most recent HbA1c of 6%.  She has a history of hypertension hypercholesterolemia and vitamin D deficiency.  She has a history of psoriasis.  She has had 2 prior cesarean sections and laparoscopic cholecystectomy laparoscopic tubal ligation but no other abdominal surgeries.  She takes 81 mg of aspirin daily for general cardiovascular health.  Her family history is not remarkable for colorectal, uterine, ovary, or breast cancer.  She also reports symptoms of a lesion that has been followed and somewhat stable for approximately 6 years on the vulva which is somewhat whitish in appearance she takes clobetasol for this.  It is never been biopsied.  Current Meds:  Outpatient Encounter Medications as of 07/03/2018  Medication Sig  . aspirin 81 MG tablet Take 81 mg by mouth daily.  . calcium carbonate (OS-CAL)  600 MG TABS Take 600 mg by mouth at bedtime.  . clobetasol cream (TEMOVATE) 1.54 % Apply 1 application topically daily as needed. For psoriasis  . fluocinonide (LIDEX) 0.05 % external solution Apply 1 application topically as needed.  . hydrochlorothiazide (HYDRODIURIL) 25 MG tablet Take 25 mg by mouth daily.  Marland Kitchen losartan (COZAAR) 100 MG tablet Take 100 mg by mouth daily.  . medroxyPROGESTERone (PROVERA) 10 MG tablet Take 10 mg by mouth daily. with food  . metoprolol (LOPRESSOR) 50 MG tablet Take 50 mg by mouth 2 (two) times daily.  . Multiple Vitamin (MULITIVITAMIN WITH MINERALS) TABS Take 1 tablet by mouth daily.  . simvastatin (ZOCOR) 20 MG tablet Take 20 mg by mouth every evening.  . Vitamin D, Ergocalciferol, (DRISDOL) 50000 UNITS CAPS Take 50,000 Units by mouth every 7 (seven) days. mondays  . [DISCONTINUED] meloxicam (MOBIC) 15 MG tablet Take 15 mg by mouth daily as needed for pain.   No facility-administered encounter medications on file as of 07/03/2018.     Allergy:  Allergies  Allergen Reactions  . Lisinopril Cough  . Macrodantin Nausea And Vomiting    Social Hx:   Social History   Socioeconomic History  . Marital status: Widowed    Spouse name: Not on file  . Number of children: Not on file  . Years of education: Not on file  . Highest education level: Not on file  Occupational History  . Not on file  Social Needs  . Financial resource strain: Not on file  . Food insecurity:    Worry: Not on file    Inability: Not on file  . Transportation needs:    Medical: Not on file    Non-medical: Not on file  Tobacco Use  . Smoking status: Never Smoker  . Smokeless tobacco: Never Used  Substance and Sexual Activity  . Alcohol use: No  . Drug use: No  . Sexual activity: Not Currently    Birth control/protection: Surgical  Lifestyle  . Physical activity:    Days per week: Not on file    Minutes per session: Not on file  . Stress: Not on file  Relationships  .  Social connections:    Talks on phone: Not on file    Gets together: Not on file    Attends religious service: Not on file    Active member of club or organization: Not on file    Attends meetings of clubs or organizations: Not on file    Relationship status: Not on file  . Intimate partner violence:    Fear of current or ex partner: Not on file    Emotionally abused: Not on file    Physically abused: Not on file    Forced sexual activity: Not on file  Other Topics Concern  . Not on file  Social History Narrative  . Not on file    Past Surgical Hx:  Past Surgical History:  Procedure Laterality Date  . CESAREAN SECTION     x 2  . CHOLECYSTECTOMY    . COLONOSCOPY  2016   x 3  . DILATATION & CURETTAGE/HYSTEROSCOPY WITH MYOSURE N/A 02/28/2016   Procedure: DILATATION & CURETTAGE/HYSTEROSCOPY WITH MYOSURE;  Surgeon: Janyth Pupa, DO;  Location: [facility];  Service: Gynecology;  Laterality: N/A;  Polypectomy  . HYSTEROSCOPY  W/D&C  02/27/2012   Procedure: DILATATION AND CURETTAGE /HYSTEROSCOPY;  Surgeon: Olga Millers, MD;  Location: [facility];  Service: Gynecology;  Laterality: N/A;  . TUBAL LIGATION    . WISDOM TOOTH EXTRACTION      Past Medical Hx:  Past Medical History:  Diagnosis Date  . Hypercholesteremia   . Hyperlipidemia   . Hypertension   . Post-menopausal bleeding   . Pre-diabetes    no meds  . Psoriasis     Past Gynecological History:  C/s x 2, no history of abn paps (last pap in July, 2019 with normal cytology and negative high risk HPV).  Patient's last menstrual period was 02/24/2012.  Family Hx:  Family History  Problem Relation Age of Onset  . Stomach cancer Mother   . Stroke Father   . Heart attack Father   . Congestive Heart Failure Father   . Alzheimer's disease Sister   . Esophageal cancer Brother   . Hypertension Brother   . Hypertension Brother   . Hypertension Brother   . Anesthesia problems Neg Hx   . Hypotension Neg Hx   . Malignant  hyperthermia Neg Hx   . Pseudochol deficiency Neg Hx   . Breast cancer Neg Hx     Review of Systems:  Constitutional  Feels well,    ENT Normal appearing ears and nares bilaterally Skin/Breast  No rash, sores, jaundice, itching, dryness Cardiovascular  No chest pain, shortness of breath, or edema  Pulmonary  No cough or wheeze.  Gastro Intestinal  No nausea, vomitting, or diarrhoea. No bright red blood per rectum, no abdominal pain, change in bowel movement, or constipation.  Genito Urinary  No frequency, urgency, dysuria, + vulvar lesion, postmenopausal bleeding Musculo Skeletal  No myalgia, arthralgia, joint swelling or pain  Neurologic  No weakness, numbness, change in gait,  Psychology  No depression, anxiety, insomnia.   Vitals:  Blood pressure (!) 132/57, pulse (!) 56, temperature 98.5 F (36.9 C), temperature source Oral, resp. rate 20, height 5\' 5"  (1.651 m), weight 203 lb 8 oz (92.3 kg), last menstrual period 02/24/2012, SpO2 100 %.  Physical Exam: WD in NAD Neck  Supple NROM, without any enlargements.  Lymph Node Survey No cervical supraclavicular or inguinal adenopathy Cardiovascular  Pulse normal rate, regularity and rhythm. S1 and S2 normal.  Lungs  Clear to auscultation bilateraly, without wheezes/crackles/rhonchi. Good air movement.  Skin  No rash/lesions/breakdown  Psychiatry  Alert and oriented to person, place, and time  Abdomen  Normoactive bowel sounds, abdomen soft, non-tender and obese without evidence of hernia.  Back No CVA tenderness Genito Urinary  Vulva/vagina: leukoplakia slightly roughened at perineal body..  Bladder/urethra:  No lesions or masses, well supported bladder  Vagina: normal  Cervix: Normal appearing, no lesions.  Uterus:  Small, mobile, no parametrial involvement or nodularity.  Adnexa: no palpable masses. Rectal  deferred  Extremities  No bilateral cyanosis, clubbing or edema.   Thereasa Solo, MD  07/03/2018,  10:14 AM

## 2018-07-03 NOTE — Patient Instructions (Addendum)
Preparing for your Surgery  Plan for surgery on July 15, 2018 with Dr. Everitt Amber at [facility] will be scheduled for a robotic assisted total laparoscopic hysterectomy, bilateral salpingo-oophorectomy, sentinel lymph node biopsy.  Pre-operative Testing -You will receive a phone call from presurgical testing at [facility] to arrange for a pre-operative testing appointment before your surgery.  This appointment normally occurs one to two weeks before your scheduled surgery.   -Bring your insurance card, copy of an advanced directive if applicable, medication list  -At that visit, you will be asked to sign a consent for a possible blood transfusion in case a transfusion becomes necessary during surgery.  The need for a blood transfusion is rare but having consent is a necessary part of your care.    -STOP TAKING ASPIRIN NOW.  Day Before Surgery at [facility] will be asked to take in a light diet the day before surgery.  Avoid carbonated beverages.  You will be advised to have nothing to eat or drink after midnight the evening before.    Eat a light diet the day before surgery.  Examples including soups, broths, toast, yogurt, mashed potatoes.  Things to avoid include carbonated beverages (fizzy beverages), raw fruits and raw vegetables, or beans.   If your bowels are filled with gas, your surgeon will have difficulty visualizing your pelvic organs which increases your surgical risks.  Your role in recovery Your role is to become active as soon as directed by your doctor, while still giving yourself time to heal.  Rest when you feel tired. You will be asked to do the following in order to speed your recovery:  - Cough and breathe deeply. This helps toclear and expand your lungs and can prevent pneumonia. You may be given a spirometer to practice deep breathing. A staff member will show you how to use the spirometer. - Do mild physical activity. Walking or  moving your legs help your circulation and body functions return to normal. A staff member will help you when you try to walk and will provide you with simple exercises. Do not try to get up or walk alone the first time. - Actively manage your pain. Managing your pain lets you move in comfort. We will ask you to rate your pain on a scale of zero to 10. It is your responsibility to tell your doctor or nurse where and how much you hurt so your pain can be treated.  Special Considerations -If you are diabetic, you may be placed on insulin after surgery to have closer control over your blood sugars to promote healing and recovery.  This does not mean that you will be discharged on insulin.  If applicable, your oral antidiabetics will be resumed when you are tolerating a solid diet.  -Your final pathology results from surgery should be available by the Friday after surgery and the results will be relayed to you when available.  -Dr. Precious Haws is the Surgeon that assists your GYN Oncologist with surgery.  The next day after your surgery you will either see your GYN Oncologist, Dr. Gerarda Fraction, or Dr. Lahoma Crocker.   Blood Transfusion Information WHAT IS A BLOOD TRANSFUSION? A transfusion is the replacement of blood or some of its parts. Blood is made up of multiple cells which provide different functions.  Red blood cells carry oxygen and are used for blood loss replacement.  White blood cells fight against infection.  Platelets control bleeding.  Plasma  helps clot blood.  Other blood products are available for specialized needs, such as hemophilia or other clotting disorders. BEFORE THE TRANSFUSION  Who gives blood for transfusions?   You may be able to donate blood to be used at a later date on yourself (autologous donation).  Relatives can be asked to donate blood. This is generally not any safer than if you have received blood from a stranger. The same precautions are taken to ensure  safety when a relative's blood is donated.  Healthy volunteers who are fully evaluated to make sure their blood is safe. This is blood bank blood. Transfusion therapy is the safest it has ever been in the practice of medicine. Before blood is taken from a donor, a complete history is taken to make sure that person has no history of diseases nor engages in risky social behavior (examples are intravenous drug use or sexual activity with multiple partners). The donor's travel history is screened to minimize risk of transmitting infections, such as malaria. The donated blood is tested for signs of infectious diseases, such as HIV and hepatitis. The blood is then tested to be sure it is compatible with you in order to minimize the chance of a transfusion reaction. If you or a relative donates blood, this is often done in anticipation of surgery and is not appropriate for emergency situations. It takes many days to process the donated blood. RISKS AND COMPLICATIONS Although transfusion therapy is very safe and saves many lives, the main dangers of transfusion include:   Getting an infectious disease.  Developing a transfusion reaction. This is an allergic reaction to something in the blood you were given. Every precaution is taken to prevent this. The decision to have a blood transfusion has been considered carefully by your caregiver before blood is given. Blood is not given unless the benefits outweigh the risks.

## 2018-07-03 NOTE — Progress Notes (Signed)
Consult Note: Gyn-Onc  Consult was requested by Dr. Debroah Baller, NP for the evaluation of Katherine Frederick 67 y.o. female  CC:  Chief Complaint  Patient presents with  . Adenocarcinoma [facility])    Assessment/Plan:  [name]  is a 67 y.o.  year old with grade 1 endometrial cancer.  A detailed discussion was held with the patient and her family with regard to to her endometrial cancer diagnosis. We discussed the standard management options for uterine cancer which includes surgery followed possibly by adjuvant therapy depending on the results of surgery. The options for surgical management include a hysterectomy and removal of the tubes and ovaries possibly with removal of pelvic and para-aortic lymph nodes.If feasible, a minimally invasive approach including a robotic hysterectomy or laparoscopic hysterectomy have benefits including shorter hospital stay, recovery time and better wound healing than with open surgery. The patient has been counseled about these surgical options and the risks of surgery in general including infection, bleeding, damage to surrounding structures (including bowel, bladder, ureters, nerves or vessels), and the postoperative risks of PE/ DVT, and lymphedema. I extensively reviewed the additional risks of robotic hysterectomy including possible need for conversion to open laparotomy.  I discussed positioning during surgery of trendelenberg and risks of minor facial swelling and care we take in preoperative positioning.  After counseling and consideration of her options, she desires to proceed with robotic assisted total hysterectomy with bilateral sapingo-oophorectomy and SLN biopsy.   She will be seen by anesthesia for preoperative clearance and discussion of postoperative pain management.  She was given the opportunity to ask questions, which were answered to her satisfaction, and she is agreement with the above mentioned plan of care.  She will hold her  ASA preoperatively.  Surgery has been scheduled for 07/15/18.  Vulvar lesion: lichen sclerosis vs VIN  - plan for vulvar biopsy at time of surgery.   HPI: [name] is a very pleasant 67 year old P2 who is seen in consultation at the request of Dr Nelda Marseille and Delice Bison NP for grade 1 endometrioid endometrial cancer.  The patient has a history of postmenopausal bleeding since approximately 2011.  She has had intermittent biopsies and D&Cs which had always returned as benign polyps.  She had a new episode of bright red vaginal bleeding that was somewhat different in characteristic in June 2019.  This prompted her to be seen by Delice Bison who performed a transvaginal ultrasound scan on June 18, 2018 which revealed a uterus measuring 4.3 x 7.5 x 4.8 cm with an endometrial thickness of 1.6 cm.  The ovaries were grossly normal bilaterally.  An endometrial [place] biopsy was then performed on the same day which revealed endometrioid adenocarcinoma FIGO grade 1 arising the background of complex atypical hyperplasia.  Patient otherwise carries a medical history of diet-controlled prediabetes with most recent HbA1c of 6%.  She has a history of hypertension hypercholesterolemia and vitamin D deficiency.  She has a history of psoriasis.  She has had 2 prior cesarean sections and laparoscopic cholecystectomy laparoscopic tubal ligation but no other abdominal surgeries.  She takes 81 mg of aspirin daily for general cardiovascular health.  Her family history is not remarkable for colorectal, uterine, ovary, or breast cancer.  She also reports symptoms of a lesion that has been followed and somewhat stable for approximately 6 years on the vulva which is somewhat whitish in appearance she takes clobetasol for this.  It is never been biopsied.  Current Meds:  Outpatient Encounter Medications as of 07/03/2018  Medication Sig  . aspirin 81 MG tablet Take 81 mg by mouth daily.  . calcium carbonate (OS-CAL)  600 MG TABS Take 600 mg by mouth at bedtime.  . clobetasol cream (TEMOVATE) 3.24 % Apply 1 application topically daily as needed. For psoriasis  . fluocinonide (LIDEX) 0.05 % external solution Apply 1 application topically as needed.  . hydrochlorothiazide (HYDRODIURIL) 25 MG tablet Take 25 mg by mouth daily.  Marland Kitchen losartan (COZAAR) 100 MG tablet Take 100 mg by mouth daily.  . medroxyPROGESTERone (PROVERA) 10 MG tablet Take 10 mg by mouth daily. with food  . metoprolol (LOPRESSOR) 50 MG tablet Take 50 mg by mouth 2 (two) times daily.  . Multiple Vitamin (MULITIVITAMIN WITH MINERALS) TABS Take 1 tablet by mouth daily.  . simvastatin (ZOCOR) 20 MG tablet Take 20 mg by mouth every evening.  . Vitamin D, Ergocalciferol, (DRISDOL) 50000 UNITS CAPS Take 50,000 Units by mouth every 7 (seven) days. mondays  . [DISCONTINUED] meloxicam (MOBIC) 15 MG tablet Take 15 mg by mouth daily as needed for pain.   No facility-administered encounter medications on file as of 07/03/2018.     Allergy:  Allergies  Allergen Reactions  . Lisinopril Cough  . Macrodantin Nausea And Vomiting    Social Hx:   Social History   Socioeconomic History  . Marital status: Widowed    Spouse name: Not on file  . Number of children: Not on file  . Years of education: Not on file  . Highest education level: Not on file  Occupational History  . Not on file  Social Needs  . Financial resource strain: Not on file  . Food insecurity:    Worry: Not on file    Inability: Not on file  . Transportation needs:    Medical: Not on file    Non-medical: Not on file  Tobacco Use  . Smoking status: Never Smoker  . Smokeless tobacco: Never Used  Substance and Sexual Activity  . Alcohol use: No  . Drug use: No  . Sexual activity: Not Currently    Birth control/protection: Surgical  Lifestyle  . Physical activity:    Days per week: Not on file    Minutes per session: Not on file  . Stress: Not on file  Relationships  .  Social connections:    Talks on phone: Not on file    Gets together: Not on file    Attends religious service: Not on file    Active member of club or organization: Not on file    Attends meetings of clubs or organizations: Not on file    Relationship status: Not on file  . Intimate partner violence:    Fear of current or ex partner: Not on file    Emotionally abused: Not on file    Physically abused: Not on file    Forced sexual activity: Not on file  Other Topics Concern  . Not on file  Social History Narrative  . Not on file    Past Surgical Hx:  Past Surgical History:  Procedure Laterality Date  . CESAREAN SECTION     x 2  . CHOLECYSTECTOMY    . COLONOSCOPY  2016   x 3  . DILATATION & CURETTAGE/HYSTEROSCOPY WITH MYOSURE N/A 02/28/2016   Procedure: DILATATION & CURETTAGE/HYSTEROSCOPY WITH MYOSURE;  Surgeon: Janyth Pupa, DO;  Location: [facility];  Service: Gynecology;  Laterality: N/A;  Polypectomy  . HYSTEROSCOPY  W/D&C  02/27/2012   Procedure: DILATATION AND CURETTAGE /HYSTEROSCOPY;  Surgeon: Olga Millers, MD;  Location: [facility];  Service: Gynecology;  Laterality: N/A;  . TUBAL LIGATION    . WISDOM TOOTH EXTRACTION      Past Medical Hx:  Past Medical History:  Diagnosis Date  . Hypercholesteremia   . Hyperlipidemia   . Hypertension   . Post-menopausal bleeding   . Pre-diabetes    no meds  . Psoriasis     Past Gynecological History:  C/s x 2, no history of abn paps (last pap in July, 2019 with normal cytology and negative high risk HPV).  Patient's last menstrual period was 02/24/2012.  Family Hx:  Family History  Problem Relation Age of Onset  . Stomach cancer Mother   . Stroke Father   . Heart attack Father   . Congestive Heart Failure Father   . Alzheimer's disease Sister   . Esophageal cancer Brother   . Hypertension Brother   . Hypertension Brother   . Hypertension Brother   . Anesthesia problems Neg Hx   . Hypotension Neg Hx   . Malignant  hyperthermia Neg Hx   . Pseudochol deficiency Neg Hx   . Breast cancer Neg Hx     Review of Systems:  Constitutional  Feels well,    ENT Normal appearing ears and nares bilaterally Skin/Breast  No rash, sores, jaundice, itching, dryness Cardiovascular  No chest pain, shortness of breath, or edema  Pulmonary  No cough or wheeze.  Gastro Intestinal  No nausea, vomitting, or diarrhoea. No bright red blood per rectum, no abdominal pain, change in bowel movement, or constipation.  Genito Urinary  No frequency, urgency, dysuria, + vulvar lesion, postmenopausal bleeding Musculo Skeletal  No myalgia, arthralgia, joint swelling or pain  Neurologic  No weakness, numbness, change in gait,  Psychology  No depression, anxiety, insomnia.   Vitals:  Blood pressure (!) 132/57, pulse (!) 56, temperature 98.5 F (36.9 C), temperature source Oral, resp. rate 20, height 5\' 5"  (1.651 m), weight 203 lb 8 oz (92.3 kg), last menstrual period 02/24/2012, SpO2 100 %.  Physical Exam: WD in NAD Neck  Supple NROM, without any enlargements.  Lymph Node Survey No cervical supraclavicular or inguinal adenopathy Cardiovascular  Pulse normal rate, regularity and rhythm. S1 and S2 normal.  Lungs  Clear to auscultation bilateraly, without wheezes/crackles/rhonchi. Good air movement.  Skin  No rash/lesions/breakdown  Psychiatry  Alert and oriented to person, place, and time  Abdomen  Normoactive bowel sounds, abdomen soft, non-tender and obese without evidence of hernia.  Back No CVA tenderness Genito Urinary  Vulva/vagina: leukoplakia slightly roughened at perineal body..  Bladder/urethra:  No lesions or masses, well supported bladder  Vagina: normal  Cervix: Normal appearing, no lesions.  Uterus:  Small, mobile, no parametrial involvement or nodularity.  Adnexa: no palpable masses. Rectal  deferred  Extremities  No bilateral cyanosis, clubbing or edema.   Thereasa Solo, MD  07/03/2018,  10:14 AM

## 2018-07-08 NOTE — Patient Instructions (Addendum)
ROX MCGRIFF  07/08/2018   Your procedure is scheduled on: 07-15-18   Report to [facility] Main  Entrance              Report to admitting at       1200 PM    Call this number if you have problems the morning of surgery [phone]    Remember:   FOLLOW A LIGHT DIET THE DAY BEFORE SURGERY EXAMPLES ARE TOAST YOGURT, MASHED POTATOES, SOUPS AND BROTHS. AVOID: RAW FRUITS AND VEGGIES CARBONATED BEVERAGES AND BEANS   NO SOLID FOOD AFTER MIDNIGHT THE NIGHT PRIOR TO SURGERY. NOTHING BY MOUTH EXCEPT CLEAR LIQUIDS UNTIL 3 HOURS PRIOR TO [place] SURGERY. PLEASE FINISH ENSURE DRINK PER SURGEON ORDER 3 HOURS PRIOR TO SCHEDULED SURGERY TIME WHICH NEEDS TO BE COMPLETED AT __1130am__________.    CLEAR LIQUID DIET   Foods Allowed                                                                     Foods Excluded  Coffee and tea, regular and decaf                             liquids that you cannot  Plain Jell-O in any flavor                                             see through such as: Fruit ices (not with fruit pulp)                                     milk, soups, orange juice  Iced Popsicles                                    All solid food                                   Cranberry, grape and apple juices Sports drinks like Gatorade Lightly seasoned clear broth or consume(fat free) Sugar, honey syrup  _____________________________________________________________________     Take these medicines the morning of surgery with A SIP OF WATER: metoprolol                                You may not have any metal on your body including hair pins and              piercings  Do not wear jewelry, make-up, lotions, powders or perfumes, deodorant             Do not wear nail polish.  Do not shave  48 hours prior to surgery.     Do not bring valuables to the hospital. [place] IS NOT  RESPONSIBLE   FOR VALUABLES.  Contacts, dentures or bridgework may  not be worn into surgery.  Leave suitcase in the car. After surgery it may be brought to your room.                 Please read over the following fact sheets you were given: _____________________________________________________________________ [facility] - Preparing for Surgery Before surgery, you can play an important role.  Because skin is not sterile, your skin needs to be as free of germs as possible.  You can reduce the number of germs on your skin by washing with CHG (chlorahexidine gluconate) soap before surgery.  CHG is an antiseptic cleaner which kills germs and bonds with the skin to continue killing germs even after washing. Please DO NOT use if you have an allergy to CHG or antibacterial soaps.  If your skin becomes reddened/irritated stop using the CHG and inform your nurse when you arrive at Short Stay. Do not shave (including legs and underarms) for at least 48 hours prior to the first CHG shower.  You may shave your face/neck. Please follow these instructions carefully:  1.  Shower with CHG Soap the night before surgery and the  morning of Surgery.  2.  If you choose to wash your hair, wash your hair first as usual with your  normal  shampoo.  3.  After you shampoo, rinse your hair and body thoroughly to remove the  shampoo.                           4.  Use CHG as you would any other liquid soap.  You can apply chg directly  to the skin and wash                       Gently with a scrungie or clean washcloth.  5.  Apply the CHG Soap to your body ONLY FROM THE NECK DOWN.   Do not use on face/ open                           Wound or open sores. Avoid contact with eyes, ears mouth and genitals (private parts).                       Wash face,  Genitals (private parts) with your normal soap.             6.  Wash thoroughly, paying special attention to the area where your surgery  will be performed.  7.  Thoroughly rinse your body with warm water from the neck down.  8.  DO NOT  shower/wash with your normal soap after using and rinsing off  the CHG Soap.                9.  Pat yourself dry with a clean towel.            10.  Wear clean pajamas.            11.  Place clean sheets on your bed the night of your first shower and do not  sleep with pets. Day of Surgery : Do not apply any lotions/deodorants the morning of surgery.  Please wear clean clothes to the hospital/surgery center.  FAILURE TO FOLLOW THESE INSTRUCTIONS MAY RESULT IN THE CANCELLATION OF YOUR SURGERY PATIENT SIGNATURE_________________________________  NURSE SIGNATURE__________________________________  ________________________________________________________________________  WHAT IS A BLOOD TRANSFUSION? Blood Transfusion Information  A transfusion is the replacement of blood or some of its parts. Blood is made up of multiple cells which provide different functions.  Red blood cells carry oxygen and are used for blood loss replacement.  White blood cells fight against infection.  Platelets control bleeding.  Plasma helps clot blood.  Other blood products are available for specialized needs, such as hemophilia or other clotting disorders. BEFORE THE TRANSFUSION  Who gives blood for transfusions?   Healthy volunteers who are fully evaluated to make sure their blood is safe. This is blood bank blood. Transfusion therapy is the safest it has ever been in the practice of medicine. Before blood is taken from a donor, a complete history is taken to make sure that person has no history of diseases nor engages in risky social behavior (examples are intravenous drug use or sexual activity with multiple partners). The donor's travel history is screened to minimize risk of transmitting infections, such as malaria. The donated blood is tested for signs of infectious diseases, such as HIV and hepatitis. The blood is then tested to be sure it is compatible with you in order to minimize the chance of a  transfusion reaction. If you or a relative donates blood, this is often done in anticipation of surgery and is not appropriate for emergency situations. It takes many days to process the donated blood. RISKS AND COMPLICATIONS Although transfusion therapy is very safe and saves many lives, the main dangers of transfusion include:   Getting an infectious disease.  Developing a transfusion reaction. This is an allergic reaction to something in the blood you were given. Every precaution is taken to prevent this. The decision to have a blood transfusion has been considered carefully by your caregiver before blood is given. Blood is not given unless the benefits outweigh the risks. AFTER THE TRANSFUSION  Right after receiving a blood transfusion, you will usually feel much better and more energetic. This is especially true if your red blood cells have gotten low (anemic). The transfusion raises the level of the red blood cells which carry oxygen, and this usually causes an energy increase.  The nurse administering the transfusion will monitor you carefully for complications. HOME CARE INSTRUCTIONS  No special instructions are needed after a transfusion. You may find your energy is better. Speak with your caregiver about any limitations on activity for underlying diseases you may have. SEEK MEDICAL CARE IF:   Your condition is not improving after your transfusion.  You develop redness or irritation at the intravenous (IV) site. SEEK IMMEDIATE MEDICAL CARE IF:  Any of the following symptoms occur over the next 12 hours:  Shaking chills.  You have a temperature by mouth above 102 F (38.9 C), not controlled by medicine.  Chest, back, or muscle pain.  People around you feel you are not acting correctly or are confused.  Shortness of breath or difficulty breathing.  Dizziness and fainting.  You get a rash or develop hives.  You have a decrease in urine output.  Your urine turns a dark  color or changes to pink, red, or brown. Any of the following symptoms occur over the next 10 days:  You have a temperature by mouth above 102 F (38.9 C), not controlled by medicine.  Shortness of breath.  Weakness after normal activity.  The white part of the eye turns yellow (jaundice).  You have a decrease in the amount of urine  or are urinating less often.  Your urine turns a dark color or changes to pink, red, or brown. Document Released: 11/22/2000 Document Revised: 02/17/2012 Document Reviewed: 07/11/2008 ExitCare Patient Information 2014 [place].  _______________________________________________________________________  Incentive Spirometer  An incentive spirometer is a tool that can help keep your lungs clear and active. This tool measures how well you are filling your lungs with each breath. Taking long deep breaths may help reverse or decrease the chance of developing breathing (pulmonary) problems (especially infection) following:  A long period of time when you are unable to move or be active. BEFORE THE PROCEDURE   If the spirometer includes an indicator to show your best effort, your nurse or respiratory therapist will set it to a desired goal.  If possible, sit up straight or lean slightly forward. Try not to slouch.  Hold the incentive spirometer in an upright position. INSTRUCTIONS FOR USE  1. Sit on the edge of your bed if possible, or sit up as far as you can in bed or on a chair. 2. Hold the incentive spirometer in an upright position. 3. Breathe out normally. 4. Place the mouthpiece in your mouth and seal your lips tightly around it. 5. Breathe in slowly and as deeply as possible, raising the piston or the ball toward the top of the column. 6. Hold your breath for 3-5 seconds or for as long as possible. Allow the piston or ball to fall to the bottom of the column. 7. Remove the mouthpiece from your mouth and breathe out normally. 8. Rest for a few  seconds and repeat Steps 1 through 7 at least 10 times every 1-2 hours when you are awake. Take your time and take a few normal breaths between deep breaths. 9. The spirometer may include an indicator to show your best effort. Use the indicator as a goal to work toward during each repetition. 10. After each set of 10 deep breaths, practice coughing to be sure your lungs are clear. If you have an incision (the cut made at the time of surgery), support your incision when coughing by placing a pillow or rolled up towels firmly against it. Once you are able to get out of bed, walk around indoors and cough well. You may stop using the incentive spirometer when instructed by your caregiver.  RISKS AND COMPLICATIONS  Take your time so you do not get dizzy or light-headed.  If you are in pain, you may need to take or ask for pain medication before doing incentive spirometry. It is harder to take a deep breath if you are having pain. AFTER USE  Rest and breathe slowly and easily.  It can be helpful to keep track of a log of your progress. Your caregiver can provide you with a simple table to help with this. If you are using the spirometer at home, follow these instructions: [place] IF:   You are having difficultly using the spirometer.  You have trouble using the spirometer as often as instructed.  Your pain medication is not giving enough relief while using the spirometer.  You develop fever of 100.5 F (38.1 C) or higher. SEEK IMMEDIATE MEDICAL CARE IF:   You cough up bloody sputum that had not been present before.  You develop fever of 102 F (38.9 C) or greater.  You develop worsening pain at or near the incision site. MAKE SURE YOU:   Understand these instructions.  Will watch your condition.  Will get help  right away if you are not doing well or get worse. Document Released: 04/07/2007 Document Revised: 02/17/2012 Document Reviewed: 06/08/2007 [facility] Patient  Information 2014 [place], [place].   ________________________________________________________________________

## 2018-07-08 NOTE — Progress Notes (Signed)
ECHO 02-12-16 Epic   STRESS 02-12-16 Epic

## 2018-07-09 ENCOUNTER — Encounter (HOSPITAL_COMMUNITY): Payer: Self-pay

## 2018-07-09 ENCOUNTER — Encounter (HOSPITAL_COMMUNITY)
Admission: RE | Admit: 2018-07-09 | Discharge: 2018-07-09 | Disposition: A | Discharge status: Home / Self Care | Payer: Medicare Other | Source: Ambulatory Visit | Attending: Gynecologic Oncology | Admitting: Gynecologic Oncology

## 2018-07-09 ENCOUNTER — Other Ambulatory Visit: Payer: Self-pay

## 2018-07-09 DIAGNOSIS — R7303 Prediabetes: Secondary | ICD-10-CM | POA: Insufficient documentation

## 2018-07-09 DIAGNOSIS — Z01810 Encounter for preprocedural cardiovascular examination: Secondary | ICD-10-CM | POA: Insufficient documentation

## 2018-07-09 DIAGNOSIS — Z01818 Encounter for other preprocedural examination: Secondary | ICD-10-CM | POA: Diagnosis not present

## 2018-07-09 DIAGNOSIS — C541 Malignant neoplasm of endometrium: Secondary | ICD-10-CM | POA: Insufficient documentation

## 2018-07-09 DIAGNOSIS — R001 Bradycardia, unspecified: Secondary | ICD-10-CM | POA: Insufficient documentation

## 2018-07-09 HISTORY — DX: Unspecified osteoarthritis, unspecified site: M19.90

## 2018-07-09 HISTORY — DX: Malignant (primary) neoplasm, unspecified: C80.1

## 2018-07-09 LAB — CBC
HCT: 40.8 % (ref 36.0–46.0)
Hemoglobin: 13.6 g/dL (ref 12.0–15.0)
MCH: 29.6 pg (ref 26.0–34.0)
MCHC: 33.3 g/dL (ref 30.0–36.0)
MCV: 88.7 fL (ref 78.0–100.0)
Platelets: 271 10*3/uL (ref 150–400)
RBC: 4.6 MIL/uL (ref 3.87–5.11)
RDW: 13.5 % (ref 11.5–15.5)
WBC: 9.4 10*3/uL (ref 4.0–10.5)

## 2018-07-09 LAB — URINALYSIS, ROUTINE W REFLEX MICROSCOPIC
Bilirubin Urine: NEGATIVE
Glucose, UA: NEGATIVE mg/dL
Hgb urine dipstick: NEGATIVE
KETONES UR: NEGATIVE mg/dL
LEUKOCYTES UA: NEGATIVE
Nitrite: NEGATIVE
PROTEIN: NEGATIVE mg/dL
Specific Gravity, Urine: 1.017 (ref 1.005–1.030)
pH: 7 (ref 5.0–8.0)

## 2018-07-09 LAB — COMPREHENSIVE METABOLIC PANEL
ALT: 22 U/L (ref 0–44)
ANION GAP: 10 (ref 5–15)
AST: 21 U/L (ref 15–41)
Albumin: 3.8 g/dL (ref 3.5–5.0)
Alkaline Phosphatase: 64 U/L (ref 38–126)
BUN: 18 mg/dL (ref 8–23)
CO2: 26 mmol/L (ref 22–32)
Calcium: 9.2 mg/dL (ref 8.9–10.3)
Chloride: 106 mmol/L (ref 98–111)
Creatinine, Ser: 0.78 mg/dL (ref 0.44–1.00)
GFR calc Af Amer: >60 mL/min (ref >60)
GFR calc non Af Amer: >60 mL/min (ref >60)
Glucose, Bld: 108 mg/dL — ABNORMAL HIGH (ref 70–99)
POTASSIUM: 3.6 mmol/L (ref 3.5–5.1)
Sodium: 142 mmol/L (ref 135–145)
Total Bilirubin: 0.9 mg/dL (ref 0.3–1.2)
Total Protein: 7.1 g/dL (ref 6.5–8.1)

## 2018-07-09 LAB — HEMOGLOBIN A1C
HEMOGLOBIN A1C: 5.9 % — AB (ref 4.8–5.6)
Mean Plasma Glucose: 122.63 mg/dL

## 2018-07-09 LAB — ABO/RH: ABO/RH(D): O POS

## 2018-07-10 NOTE — Progress Notes (Signed)
Final Ekg in epic

## 2018-07-15 ENCOUNTER — Ambulatory Visit (HOSPITAL_COMMUNITY): Payer: Medicare Other | Admitting: Anesthesiology

## 2018-07-15 ENCOUNTER — Ambulatory Visit (HOSPITAL_COMMUNITY)
Admission: RE | Admit: 2018-07-15 | Discharge: 2018-07-16 | Disposition: A | Discharge status: Home / Self Care | Payer: Medicare Other | Source: Ambulatory Visit | Attending: Gynecologic Oncology | Admitting: Gynecologic Oncology

## 2018-07-15 ENCOUNTER — Encounter (HOSPITAL_COMMUNITY): Payer: Self-pay | Admitting: General Practice

## 2018-07-15 ENCOUNTER — Encounter (HOSPITAL_COMMUNITY)
Admission: RE | Disposition: A | Discharge status: Home / Self Care | Payer: Self-pay | Source: Ambulatory Visit | Attending: Gynecologic Oncology

## 2018-07-15 ENCOUNTER — Other Ambulatory Visit: Payer: Self-pay

## 2018-07-15 DIAGNOSIS — Z79899 Other long term (current) drug therapy: Secondary | ICD-10-CM | POA: Insufficient documentation

## 2018-07-15 DIAGNOSIS — C541 Malignant neoplasm of endometrium: Secondary | ICD-10-CM | POA: Diagnosis not present

## 2018-07-15 DIAGNOSIS — N189 Chronic kidney disease, unspecified: Secondary | ICD-10-CM | POA: Diagnosis not present

## 2018-07-15 DIAGNOSIS — Z7982 Long term (current) use of aspirin: Secondary | ICD-10-CM | POA: Diagnosis not present

## 2018-07-15 DIAGNOSIS — E1122 Type 2 diabetes mellitus with diabetic chronic kidney disease: Secondary | ICD-10-CM | POA: Insufficient documentation

## 2018-07-15 DIAGNOSIS — E559 Vitamin D deficiency, unspecified: Secondary | ICD-10-CM | POA: Diagnosis not present

## 2018-07-15 DIAGNOSIS — L409 Psoriasis, unspecified: Secondary | ICD-10-CM | POA: Diagnosis not present

## 2018-07-15 DIAGNOSIS — N9089 Other specified noninflammatory disorders of vulva and perineum: Secondary | ICD-10-CM | POA: Diagnosis not present

## 2018-07-15 DIAGNOSIS — I129 Hypertensive chronic kidney disease with stage 1 through stage 4 chronic kidney disease, or unspecified chronic kidney disease: Secondary | ICD-10-CM | POA: Diagnosis not present

## 2018-07-15 DIAGNOSIS — N181 Chronic kidney disease, stage 1: Secondary | ICD-10-CM | POA: Diagnosis not present

## 2018-07-15 DIAGNOSIS — N904 Leukoplakia of vulva: Secondary | ICD-10-CM | POA: Insufficient documentation

## 2018-07-15 DIAGNOSIS — E7800 Pure hypercholesterolemia, unspecified: Secondary | ICD-10-CM | POA: Diagnosis not present

## 2018-07-15 DIAGNOSIS — E785 Hyperlipidemia, unspecified: Secondary | ICD-10-CM | POA: Diagnosis not present

## 2018-07-15 DIAGNOSIS — E1022 Type 1 diabetes mellitus with diabetic chronic kidney disease: Secondary | ICD-10-CM | POA: Diagnosis not present

## 2018-07-15 HISTORY — PX: VULVA /PERINEUM BIOPSY: SHX319

## 2018-07-15 HISTORY — PX: ROBOTIC ASSISTED TOTAL HYSTERECTOMY WITH BILATERAL SALPINGO OOPHERECTOMY: SHX6086

## 2018-07-15 LAB — TYPE AND SCREEN
ABO/RH(D): O POS
Antibody Screen: NEGATIVE

## 2018-07-15 SURGERY — HYSTERECTOMY, TOTAL, ROBOT-ASSISTED, LAPAROSCOPIC, WITH BILATERAL SALPINGO-OOPHORECTOMY
Anesthesia: General

## 2018-07-15 MED ORDER — ACETAMINOPHEN 500 MG PO TABS
1000.0000 mg | ORAL_TABLET | Freq: Four times a day (QID) | ORAL | Status: DC
  Administered 2018-07-15 – 2018-07-16 (×3): 1000 mg via ORAL
  Filled 2018-07-15 (×3): qty 2

## 2018-07-15 MED ORDER — KETOROLAC TROMETHAMINE 30 MG/ML IJ SOLN
INTRAMUSCULAR | Status: AC
  Filled 2018-07-15: qty 1

## 2018-07-15 MED ORDER — LIDOCAINE 2% (20 MG/ML) 5 ML SYRINGE
INTRAMUSCULAR | Status: AC
  Filled 2018-07-15: qty 5

## 2018-07-15 MED ORDER — ENSURE SURGERY PO LIQD
237.0000 mL | Freq: Two times a day (BID) | ORAL | Status: DC
  Administered 2018-07-16: 237 mL via ORAL
  Filled 2018-07-15 (×2): qty 237

## 2018-07-15 MED ORDER — DEXAMETHASONE SODIUM PHOSPHATE 10 MG/ML IJ SOLN
INTRAMUSCULAR | Status: AC
  Filled 2018-07-15: qty 1

## 2018-07-15 MED ORDER — ONDANSETRON HCL 4 MG/2ML IJ SOLN: 4.0000 mg | Freq: Once | INTRAMUSCULAR | Status: DC | PRN

## 2018-07-15 MED ORDER — CELECOXIB 200 MG PO CAPS
200.0000 mg | ORAL_CAPSULE | Freq: Two times a day (BID) | ORAL | Status: DC
  Administered 2018-07-15 – 2018-07-16 (×2): 200 mg via ORAL
  Filled 2018-07-15 (×2): qty 1

## 2018-07-15 MED ORDER — MIDAZOLAM HCL 2 MG/2ML IJ SOLN
INTRAMUSCULAR | Status: DC | PRN
  Administered 2018-07-15: 2 mg via INTRAVENOUS

## 2018-07-15 MED ORDER — HYDROMORPHONE HCL 1 MG/ML IJ SOLN: 0.5000 mg | INTRAMUSCULAR | Status: DC | PRN

## 2018-07-15 MED ORDER — SCOPOLAMINE 1 MG/3DAYS TD PT72
MEDICATED_PATCH | TRANSDERMAL | Status: AC
  Filled 2018-07-15: qty 1

## 2018-07-15 MED ORDER — SIMVASTATIN 20 MG PO TABS
20.0000 mg | ORAL_TABLET | Freq: Every evening | ORAL | Status: DC
  Administered 2018-07-15: 20 mg via ORAL
  Filled 2018-07-15: qty 1

## 2018-07-15 MED ORDER — ONDANSETRON HCL 4 MG/2ML IJ SOLN
INTRAMUSCULAR | Status: AC
  Filled 2018-07-15: qty 2

## 2018-07-15 MED ORDER — LACTATED RINGERS IV SOLN
INTRAVENOUS | Status: DC | PRN
  Administered 2018-07-15 (×2): via INTRAVENOUS

## 2018-07-15 MED ORDER — STERILE WATER FOR INJECTION IJ SOLN
INTRAMUSCULAR | Status: DC | PRN
  Administered 2018-07-15: 10 mL

## 2018-07-15 MED ORDER — ROCURONIUM BROMIDE 10 MG/ML (PF) SYRINGE
PREFILLED_SYRINGE | INTRAVENOUS | Status: DC | PRN
  Administered 2018-07-15: 10 mg via INTRAVENOUS
  Administered 2018-07-15: 5 mg via INTRAVENOUS
  Administered 2018-07-15: 45 mg via INTRAVENOUS

## 2018-07-15 MED ORDER — ACETAMINOPHEN 500 MG PO TABS
1000.0000 mg | ORAL_TABLET | ORAL | Status: AC
  Administered 2018-07-15: 1000 mg via ORAL
  Filled 2018-07-15: qty 2

## 2018-07-15 MED ORDER — PROPOFOL 10 MG/ML IV BOLUS
INTRAVENOUS | Status: DC | PRN
  Administered 2018-07-15: 50 mg via INTRAVENOUS
  Administered 2018-07-15: 100 mg via INTRAVENOUS

## 2018-07-15 MED ORDER — EPHEDRINE SULFATE 50 MG/ML IJ SOLN
INTRAMUSCULAR | Status: DC | PRN
  Administered 2018-07-15: 15 mg via INTRAVENOUS

## 2018-07-15 MED ORDER — SUGAMMADEX SODIUM 200 MG/2ML IV SOLN
INTRAVENOUS | Status: AC
  Filled 2018-07-15: qty 10

## 2018-07-15 MED ORDER — LIDOCAINE 2% (20 MG/ML) 5 ML SYRINGE
INTRAMUSCULAR | Status: DC | PRN
  Administered 2018-07-15: 100 mg via INTRAVENOUS

## 2018-07-15 MED ORDER — GABAPENTIN 300 MG PO CAPS
300.0000 mg | ORAL_CAPSULE | ORAL | Status: AC
  Administered 2018-07-15: 300 mg via ORAL
  Filled 2018-07-15: qty 1

## 2018-07-15 MED ORDER — SUGAMMADEX SODIUM 200 MG/2ML IV SOLN
INTRAVENOUS | Status: AC
  Filled 2018-07-15: qty 2

## 2018-07-15 MED ORDER — HYDROMORPHONE HCL 1 MG/ML IJ SOLN
INTRAMUSCULAR | Status: AC
  Filled 2018-07-15: qty 2

## 2018-07-15 MED ORDER — FENTANYL CITRATE (PF) 100 MCG/2ML IJ SOLN
INTRAMUSCULAR | Status: DC | PRN
  Administered 2018-07-15: 150 ug via INTRAVENOUS
  Administered 2018-07-15 (×2): 50 ug via INTRAVENOUS

## 2018-07-15 MED ORDER — MIDAZOLAM HCL 2 MG/2ML IJ SOLN
INTRAMUSCULAR | Status: AC
  Filled 2018-07-15: qty 2

## 2018-07-15 MED ORDER — MEPERIDINE HCL 50 MG/ML IJ SOLN: 6.2500 mg | INTRAMUSCULAR | Status: DC | PRN

## 2018-07-15 MED ORDER — DEXAMETHASONE SODIUM PHOSPHATE 10 MG/ML IJ SOLN
INTRAMUSCULAR | Status: DC | PRN
  Administered 2018-07-15: 10 mg via INTRAVENOUS

## 2018-07-15 MED ORDER — PROPOFOL 10 MG/ML IV BOLUS
INTRAVENOUS | Status: AC
  Filled 2018-07-15: qty 20

## 2018-07-15 MED ORDER — DEXAMETHASONE SODIUM PHOSPHATE 4 MG/ML IJ SOLN: 4.0000 mg | INTRAMUSCULAR | Status: DC

## 2018-07-15 MED ORDER — FENTANYL CITRATE (PF) 250 MCG/5ML IJ SOLN
INTRAMUSCULAR | Status: AC
  Filled 2018-07-15: qty 5

## 2018-07-15 MED ORDER — HYDROCHLOROTHIAZIDE 25 MG PO TABS
12.5000 mg | ORAL_TABLET | Freq: Every day | ORAL | Status: DC
  Administered 2018-07-15 – 2018-07-16 (×2): 12.5 mg via ORAL
  Filled 2018-07-15 (×2): qty 1

## 2018-07-15 MED ORDER — GABAPENTIN 300 MG PO CAPS
300.0000 mg | ORAL_CAPSULE | Freq: Two times a day (BID) | ORAL | Status: DC
  Administered 2018-07-15 – 2018-07-16 (×2): 300 mg via ORAL
  Filled 2018-07-15 (×2): qty 1

## 2018-07-15 MED ORDER — LOSARTAN POTASSIUM 50 MG PO TABS
100.0000 mg | ORAL_TABLET | Freq: Every day | ORAL | Status: DC
  Administered 2018-07-16: 100 mg via ORAL
  Filled 2018-07-15: qty 2

## 2018-07-15 MED ORDER — ARTIFICIAL TEARS OPHTHALMIC OINT
TOPICAL_OINTMENT | OPHTHALMIC | Status: AC
  Filled 2018-07-15: qty 3.5

## 2018-07-15 MED ORDER — KETOROLAC TROMETHAMINE 30 MG/ML IJ SOLN
INTRAMUSCULAR | Status: DC | PRN
  Administered 2018-07-15: 30 mg via INTRAVENOUS

## 2018-07-15 MED ORDER — OXYCODONE HCL 5 MG PO TABS: 5.0000 mg | ORAL_TABLET | Freq: Four times a day (QID) | ORAL | Status: DC | PRN

## 2018-07-15 MED ORDER — STERILE WATER FOR IRRIGATION IR SOLN
Status: DC | PRN
  Administered 2018-07-15: 1000 mL

## 2018-07-15 MED ORDER — KETOROLAC TROMETHAMINE 15 MG/ML IJ SOLN: 15.0000 mg | Freq: Four times a day (QID) | INTRAMUSCULAR | Status: DC | PRN

## 2018-07-15 MED ORDER — SUGAMMADEX SODIUM 200 MG/2ML IV SOLN
INTRAVENOUS | Status: DC | PRN
  Administered 2018-07-15: 400 mg via INTRAVENOUS

## 2018-07-15 MED ORDER — ENOXAPARIN SODIUM 40 MG/0.4ML ~~LOC~~ SOLN
40.0000 mg | SUBCUTANEOUS | Status: DC
  Administered 2018-07-16: 40 mg via SUBCUTANEOUS
  Filled 2018-07-15: qty 0.4

## 2018-07-15 MED ORDER — SCOPOLAMINE 1 MG/3DAYS TD PT72
1.0000 | MEDICATED_PATCH | TRANSDERMAL | Status: AC
  Administered 2018-07-15: 1.5 mg via TRANSDERMAL
  Administered 2018-07-15: 1 via TRANSDERMAL
  Filled 2018-07-15: qty 1

## 2018-07-15 MED ORDER — CEFAZOLIN SODIUM-DEXTROSE 2-4 GM/100ML-% IV SOLN
2.0000 g | INTRAVENOUS | Status: AC
  Administered 2018-07-15: 2 g via INTRAVENOUS
  Filled 2018-07-15: qty 100

## 2018-07-15 MED ORDER — POTASSIUM CHLORIDE IN NACL 20-0.45 MEQ/L-% IV SOLN
INTRAVENOUS | Status: DC
  Administered 2018-07-15: 18:00:00 via INTRAVENOUS
  Filled 2018-07-15: qty 1000

## 2018-07-15 MED ORDER — ENOXAPARIN SODIUM 40 MG/0.4ML ~~LOC~~ SOLN
40.0000 mg | SUBCUTANEOUS | Status: AC
  Administered 2018-07-15: 40 mg via SUBCUTANEOUS
  Filled 2018-07-15: qty 0.4

## 2018-07-15 MED ORDER — METOPROLOL TARTRATE 50 MG PO TABS
50.0000 mg | ORAL_TABLET | Freq: Two times a day (BID) | ORAL | Status: DC
  Administered 2018-07-15 – 2018-07-16 (×2): 50 mg via ORAL
  Filled 2018-07-15 (×2): qty 1

## 2018-07-15 MED ORDER — LACTATED RINGERS IV SOLN
Freq: Once | INTRAVENOUS | Status: AC
Start: 2018-07-15 — End: 2018-07-15
  Administered 2018-07-15: 13:00:00 via INTRAVENOUS

## 2018-07-15 MED ORDER — ACETAMINOPHEN 10 MG/ML IV SOLN
INTRAVENOUS | Status: AC
  Filled 2018-07-15: qty 100

## 2018-07-15 MED ORDER — FENTANYL CITRATE (PF) 100 MCG/2ML IJ SOLN
INTRAMUSCULAR | Status: AC
  Filled 2018-07-15: qty 2

## 2018-07-15 MED ORDER — ONDANSETRON HCL 4 MG PO TABS: 4.0000 mg | ORAL_TABLET | Freq: Four times a day (QID) | ORAL | Status: DC | PRN

## 2018-07-15 MED ORDER — ONDANSETRON HCL 4 MG/2ML IJ SOLN: 4.0000 mg | Freq: Four times a day (QID) | INTRAMUSCULAR | Status: DC | PRN

## 2018-07-15 MED ORDER — ONDANSETRON HCL 4 MG/2ML IJ SOLN
INTRAMUSCULAR | Status: DC | PRN
  Administered 2018-07-15: 4 mg via INTRAVENOUS

## 2018-07-15 MED ORDER — HYDROMORPHONE HCL 1 MG/ML IJ SOLN
0.2500 mg | INTRAMUSCULAR | Status: DC | PRN
  Administered 2018-07-15 (×3): 0.5 mg via INTRAVENOUS

## 2018-07-15 MED ORDER — ROCURONIUM BROMIDE 10 MG/ML (PF) SYRINGE
PREFILLED_SYRINGE | INTRAVENOUS | Status: AC
  Filled 2018-07-15: qty 10

## 2018-07-15 SURGICAL SUPPLY — 54 items
ADH SKN CLS APL DERMABOND .7 (GAUZE/BANDAGES/DRESSINGS) ×2
AGENT HMST KT MTR STRL THRMB (HEMOSTASIS)
APL ESCP 34 STRL LF DISP (HEMOSTASIS)
APPLICATOR SURGIFLO ENDO (HEMOSTASIS) IMPLANT
BAG LAPAROSCOPIC 12 15 PORT 16 (BASKET) IMPLANT
BAG RETRIEVAL 12/15 (BASKET)
BAG SPEC RTRVL LRG 6X4 10 (ENDOMECHANICALS)
COVER BACK TABLE 60X90IN (DRAPES) ×3 IMPLANT
COVER TIP SHEARS 8 DVNC (MISCELLANEOUS) ×2 IMPLANT
COVER TIP SHEARS 8MM DA VINCI (MISCELLANEOUS) ×1
DERMABOND ADVANCED (GAUZE/BANDAGES/DRESSINGS) ×1
DERMABOND ADVANCED .7 DNX12 (GAUZE/BANDAGES/DRESSINGS) ×2 IMPLANT
DRAPE ARM DVNC X/XI (DISPOSABLE) ×8 IMPLANT
DRAPE COLUMN DVNC XI (DISPOSABLE) ×2 IMPLANT
DRAPE DA VINCI XI ARM (DISPOSABLE) ×4
DRAPE DA VINCI XI COLUMN (DISPOSABLE) ×1
DRAPE SHEET LG 3/4 BI-LAMINATE (DRAPES) ×3 IMPLANT
DRAPE SURG IRRIG POUCH 19X23 (DRAPES) ×3 IMPLANT
ELECT REM PT RETURN 15FT ADLT (MISCELLANEOUS) ×3 IMPLANT
GLOVE BIO SURGEON STRL SZ 6 (GLOVE) ×12 IMPLANT
GLOVE BIO SURGEON STRL SZ 6.5 (GLOVE) IMPLANT
GOWN STRL REUS W/ TWL LRG LVL3 (GOWN DISPOSABLE) ×4 IMPLANT
GOWN STRL REUS W/TWL LRG LVL3 (GOWN DISPOSABLE) ×12
HOLDER FOLEY CATH W/STRAP (MISCELLANEOUS) ×2 IMPLANT
IRRIG SUCT STRYKERFLOW 2 WTIP (MISCELLANEOUS) ×3
IRRIGATION SUCT STRKRFLW 2 WTP (MISCELLANEOUS) ×2 IMPLANT
KIT PROCEDURE DA VINCI SI (MISCELLANEOUS) ×1
KIT PROCEDURE DVNC SI (MISCELLANEOUS) IMPLANT
MANIPULATOR UTERINE 4.5 ZUMI (MISCELLANEOUS) ×3 IMPLANT
NDL SPNL 18GX3.5 QUINCKE PK (NEEDLE) IMPLANT
NEEDLE SPNL 18GX3.5 QUINCKE PK (NEEDLE) ×3 IMPLANT
OBTURATOR OPTICAL STANDARD 8MM (TROCAR) ×1
OBTURATOR OPTICAL STND 8 DVNC (TROCAR) ×2
OBTURATOR OPTICALSTD 8 DVNC (TROCAR) ×2 IMPLANT
PACK ROBOT GYN CUSTOM WL (TRAY / TRAY PROCEDURE) ×3 IMPLANT
PAD POSITIONING PINK XL (MISCELLANEOUS) ×3 IMPLANT
PORT ACCESS TROCAR AIRSEAL 12 (TROCAR) ×2 IMPLANT
PORT ACCESS TROCAR AIRSEAL 5M (TROCAR) ×1
POUCH SPECIMEN RETRIEVAL 10MM (ENDOMECHANICALS) IMPLANT
PUNCH BIOPSY 3 (MISCELLANEOUS) ×1 IMPLANT
SEAL CANN UNIV 5-8 DVNC XI (MISCELLANEOUS) ×8 IMPLANT
SEAL XI 5MM-8MM UNIVERSAL (MISCELLANEOUS) ×4
SET TRI-LUMEN FLTR TB AIRSEAL (TUBING) ×3 IMPLANT
SURGIFLO W/THROMBIN 8M KIT (HEMOSTASIS) IMPLANT
SUT VIC AB 0 CT1 27 (SUTURE) ×3
SUT VIC AB 0 CT1 27XBRD ANTBC (SUTURE) IMPLANT
SUT VIC AB 3-0 SH 27 (SUTURE) ×3
SUT VIC AB 3-0 SH 27X BRD (SUTURE) IMPLANT
SYR 10ML LL (SYRINGE) ×1 IMPLANT
TOWEL OR NON WOVEN STRL DISP B (DISPOSABLE) ×3 IMPLANT
TRAP SPECIMEN MUCOUS 40CC (MISCELLANEOUS) IMPLANT
TRAY FOLEY MTR SLVR 16FR STAT (SET/KITS/TRAYS/PACK) ×3 IMPLANT
UNDERPAD 30X30 (UNDERPADS AND DIAPERS) ×3 IMPLANT
WATER STERILE IRR 1000ML POUR (IV SOLUTION) ×3 IMPLANT

## 2018-07-15 NOTE — Op Note (Signed)
OPERATIVE NOTE 07/15/18  Surgeon: Donaciano Eva   Assistants: Dr Precious Haws (an MD assistant was necessary for tissue manipulation, management of robotic instrumentation, retraction and positioning due to the complexity of the case and hospital policies).   Anesthesia: General endotracheal anesthesia  ASA Class: 3   Pre-operative Diagnosis: endometrial cancer grade 1, vulvar lesion  Post-operative Diagnosis: same,   Operation: Robotic-assisted laparoscopic total hysterectomy with bilateral salpingoophorectomy, SLN biopsy, vulvar biopsy   Surgeon: Donaciano Eva  Assistant Surgeon: Precious Haws MD  Anesthesia: GET  Urine Output: 300  Operative Findings:  : 6cm normal appearing uterus, tubes and ovaries, no suspicious nodes. 1cm area of nodularity and leukoplakia on the perineum.   Estimated Blood Loss:  20cc      Total IV Fluids: 1,000 ml         Specimens: uterus, cervix, bilateral tubes and ovaries, right obturator SLN, right presacral SLN, left external iliac SLN, left perineal biopsy.         Complications:  None; patient tolerated the procedure well.         Disposition: PACU - hemodynamically stable.  Procedure Details  The patient was seen in the Holding Room. The risks, benefits, complications, treatment options, and expected outcomes were discussed with the patient.  The patient concurred with the proposed plan, giving informed consent.  The site of surgery properly noted/marked. The patient was identified as Katherine Frederick and the procedure verified as a Robotic-assisted hysterectomy with bilateral salpingo oophorectomy with SLN biopsy and vulvar biopsy. A Time Out was held and the above information confirmed.  After induction of anesthesia, the patient was draped and prepped in the usual sterile manner. Pt was placed in supine position after anesthesia and draped and prepped in the usual sterile manner. The abdominal drape was placed after the  CholoraPrep had been allowed to dry for 3 minutes.  Her arms were tucked to her side with all appropriate precautions.  The shoulders were stabilized with padded shoulder blocks applied to the acromium processes.  The patient was placed in the semi-lithotomy position in [place].  The perineum was prepped with Betadine. The patient was then prepped. Foley catheter was placed.   The area of leukoplakia on the posterior perineal body was visualized. A 64mm punch biopsy was used to take a punch from the perineum. It was sent for permanent pathology.  A 3-0 vicryl suture established hemostasis.    A sterile speculum was placed in the vagina.  The cervix was grasped with a single-tooth tenaculum. 2mg  total of ICG was injected into the cervical stroma at 2 and 9 o'clock with 1cc injected at a 1cm and 38mm depth (concentration 0.5mg /ml) in all locations. The cervix was dilated with Kennon Rounds dilators.  The ZUMI uterine manipulator with a medium colpotomizer ring was placed without difficulty.  A pneum occluder balloon was placed over the manipulator.  OG tube placement was confirmed and to suction.   Next, a 5 mm skin incision was made 1 cm below the subcostal margin in the midclavicular line.  The 5 mm Optiview port and scope was used for direct entry.  Opening pressure was under 10 mm CO2.  The abdomen was insufflated and the findings were noted as above.   At this point and all points during the procedure, the patient's intra-abdominal pressure did not exceed 15 mmHg. Next, a 10 mm skin incision was made in the umbilicus and a right and left port was placed about 10 cm  lateral to the robot port on the right and left side.  A fourth arm was placed in the left lower quadrant 2 cm above and superior and medial to the anterior superior iliac spine.  All ports were placed under direct visualization.  The patient was placed in steep Trendelenburg.  Bowel was folded away into the upper abdomen.  The robot was docked in  the normal manner.  The right and left peritoneum were opened parallel to the IP ligament to open the retroperitoneal spaces bilaterally. The SLN mapping was performed in bilateral pelvic basins. The para rectal and paravesical spaces were opened up entirely with careful dissection below the level of the ureters bilaterally and to the depth of the uterine artery origin in order to skeletonize the uterine "web" and ensure visualization of all parametrial channels. The para-aortic basins were carefully exposed and evaluated for isolated para-aortic SLN's. Lymphatic channels were identified travelling to the following visualized sentinel lymph node's: right obturator SLN, right presacral SLN, left external iliac SLN. These SLN's were separated from their surrounding lymphatic tissue, removed and sent for permanent pathology.  The hysterectomy was started after the round ligament on the right side was incised and the retroperitoneum was entered and the pararectal space was developed.  The ureter was noted to be on the medial leaf of the broad ligament.  The peritoneum above the ureter was incised and stretched and the infundibulopelvic ligament was skeletonized, cauterized and cut.  The posterior peritoneum was taken down to the level of the KOH ring.  The anterior peritoneum was also taken down.  The bladder flap was created to the level of the KOH ring.  The uterine artery on the right side was skeletonized, cauterized and cut in the normal manner.  A similar procedure was performed on the left.  The colpotomy was made and the uterus, cervix, bilateral ovaries and tubes were amputated and delivered through the vagina.  Pedicles were inspected and excellent hemostasis was achieved.    The colpotomy at the vaginal cuff was closed with Vicryl on a CT1 needle in a running manner.  Irrigation was used and excellent hemostasis was achieved.  At this point in the procedure was completed.  Robotic instruments were  removed under direct visulaization.  The robot was undocked. The 10 mm ports were closed with Vicryl on a UR-5 needle and the fascia was closed with 0 Vicryl on a UR-5 needle.  The skin was closed with 4-0 Vicryl in a subcuticular manner.  Dermabond was applied.  Sponge, lap and needle counts correct x 2.  The patient was taken to the recovery room in stable condition.  The vagina was swabbed with  minimal bleeding noted.   All instrument and needle counts were correct x  3.   The patient was transferred to the recovery room in a stable condition.  Donaciano Eva, MD

## 2018-07-15 NOTE — Anesthesia Procedure Notes (Signed)
Procedure Name: Intubation Date/Time: 07/15/2018 2:17 PM Performed by: Wanita Chamberlain, CRNA Pre-anesthesia Checklist: Patient identified, Emergency Drugs available, Suction available, Patient being monitored and Timeout performed Patient Re-evaluated:Patient Re-evaluated prior to induction Oxygen Delivery Method: Circle system utilized Preoxygenation: Pre-oxygenation with 100% oxygen Induction Type: IV induction Ventilation: Mask ventilation without difficulty Laryngoscope Size: Mac and 3 Grade View: Grade II Tube type: Oral Tube size: 7.0 mm Number of attempts: 1 Airway Equipment and Method: Stylet Placement Confirmation: breath sounds checked- equal and bilateral,  CO2 detector,  positive ETCO2 and ETT inserted through vocal cords under direct vision Secured at: 22 cm Dental Injury: Teeth and Oropharynx as per pre-operative assessment

## 2018-07-15 NOTE — Transfer of Care (Signed)
Immediate Anesthesia Transfer of Care Note  Patient: Katherine Frederick  Procedure(s) Performed: XI ROBOTIC ASSISTED TOTAL HYSTERECTOMY WITH BILATERAL SALPINGO OOPHORECTOMY WITH SENTINEL LYMPH NODE BIOPSY AND VULVAR BIOPSY (Bilateral ) VULVAR BIOPSY (N/A )  Patient Location: PACU  Anesthesia Type:General  Level of Consciousness: awake, alert , oriented and patient cooperative  Airway & Oxygen Therapy: Patient Spontanous Breathing and Patient connected to face mask oxygen  Post-op Assessment: Report given to RN and Post -op Vital signs reviewed and stable  Post vital signs: Reviewed and stable  Last Vitals:  Vitals Value Taken Time  BP    Temp    Pulse 77 07/15/2018  3:55 PM  Resp 17 07/15/2018  3:55 PM  SpO2 100 % 07/15/2018  3:55 PM  Vitals shown include unvalidated device data.  Last Pain:  Vitals:   07/15/18 1220  TempSrc:   PainSc: 0-No pain         Complications: No apparent anesthesia complications

## 2018-07-15 NOTE — Anesthesia Preprocedure Evaluation (Addendum)
Anesthesia Evaluation  Patient identified by MRN, date of birth, ID band Patient awake    Reviewed: Allergy & Precautions, NPO status , Patient's Chart, lab work & pertinent test results  Airway Mallampati: I  TM Distance: >3 FB Neck ROM: Full    Dental  (+) Teeth Intact, Dental Advisory Given, Caps,    Pulmonary    Pulmonary exam normal        Cardiovascular hypertension, Pt. on home beta blockers and Pt. on medications Normal cardiovascular exam Rhythm:Regular Rate:Normal     Neuro/Psych    GI/Hepatic   Endo/Other  diabetes, Type 2, Oral Hypoglycemic Agents  Renal/GU      Musculoskeletal  (+) Arthritis , Osteoarthritis,    Abdominal   Peds  Hematology   Anesthesia Other Findings   Reproductive/Obstetrics                           Anesthesia Physical Anesthesia Plan  ASA: II  Anesthesia Plan: General   Post-op Pain Management:    Induction: Intravenous  PONV Risk Score and Plan: 3 and Ondansetron and Treatment may vary due to age or medical condition  Airway Management Planned: Oral ETT  Additional Equipment:   Intra-op Plan:   Post-operative Plan: Extubation in OR  Informed Consent: I have reviewed the patients History and Physical, chart, labs and discussed the procedure including the risks, benefits and alternatives for the proposed anesthesia with the patient or authorized representative who has indicated his/her understanding and acceptance.     Plan Discussed with: CRNA and Surgeon  Anesthesia Plan Comments:         Anesthesia Quick Evaluation

## 2018-07-15 NOTE — Interval H&P Note (Signed)
History and Physical Interval Note:  07/15/2018 1:21 PM  Cloyd Stagers  has presented today for surgery, with the diagnosis of endometrial cancer  The various methods of treatment have been discussed with the patient and family. After consideration of risks, benefits and other options for treatment, the patient has consented to  Procedure(s): XI ROBOTIC ASSISTED TOTAL HYSTERECTOMY WITH BILATERAL SALPINGO OOPHORECTOMY WITH SENTINEL LYMPH NODE BIOPSY AND VLUVAR BIOPSY (Bilateral) VULVAR BIOPSY (N/A) as a surgical intervention .  The patient's history has been reviewed, patient examined, no change in status, stable for surgery.  I have reviewed the patient's chart and labs.  Questions were answered to the patient's satisfaction.     Thereasa Solo

## 2018-07-16 ENCOUNTER — Encounter (HOSPITAL_COMMUNITY): Payer: Self-pay | Admitting: Gynecologic Oncology

## 2018-07-16 DIAGNOSIS — N904 Leukoplakia of vulva: Secondary | ICD-10-CM | POA: Diagnosis not present

## 2018-07-16 DIAGNOSIS — N181 Chronic kidney disease, stage 1: Secondary | ICD-10-CM | POA: Diagnosis not present

## 2018-07-16 DIAGNOSIS — C541 Malignant neoplasm of endometrium: Secondary | ICD-10-CM | POA: Diagnosis not present

## 2018-07-16 DIAGNOSIS — I129 Hypertensive chronic kidney disease with stage 1 through stage 4 chronic kidney disease, or unspecified chronic kidney disease: Secondary | ICD-10-CM | POA: Diagnosis not present

## 2018-07-16 DIAGNOSIS — E1122 Type 2 diabetes mellitus with diabetic chronic kidney disease: Secondary | ICD-10-CM | POA: Diagnosis not present

## 2018-07-16 DIAGNOSIS — E7800 Pure hypercholesterolemia, unspecified: Secondary | ICD-10-CM | POA: Diagnosis not present

## 2018-07-16 LAB — BASIC METABOLIC PANEL
ANION GAP: 11 (ref 5–15)
BUN: 20 mg/dL (ref 8–23)
CALCIUM: 8.6 mg/dL — AB (ref 8.9–10.3)
CO2: 21 mmol/L — AB (ref 22–32)
CREATININE: 0.85 mg/dL (ref 0.44–1.00)
Chloride: 104 mmol/L (ref 98–111)
GFR calc Af Amer: >60 mL/min (ref >60)
GFR calc non Af Amer: >60 mL/min (ref >60)
Glucose, Bld: 223 mg/dL — ABNORMAL HIGH (ref 70–99)
Potassium: 4.5 mmol/L (ref 3.5–5.1)
SODIUM: 136 mmol/L (ref 135–145)

## 2018-07-16 LAB — CBC
HCT: 37.5 % (ref 36.0–46.0)
HEMOGLOBIN: 12.6 g/dL (ref 12.0–15.0)
MCH: 30 pg (ref 26.0–34.0)
MCHC: 33.6 g/dL (ref 30.0–36.0)
MCV: 89.3 fL (ref 78.0–100.0)
PLATELETS: 218 10*3/uL (ref 150–400)
RBC: 4.2 MIL/uL (ref 3.87–5.11)
RDW: 13.2 % (ref 11.5–15.5)
WBC: 11.7 10*3/uL — AB (ref 4.0–10.5)

## 2018-07-16 MED ORDER — OXYCODONE HCL 5 MG PO TABS: 5.0000 mg | ORAL_TABLET | Freq: Four times a day (QID) | ORAL | 0 refills | Status: DC | PRN

## 2018-07-16 MED ORDER — SENNA 8.6 MG PO TABS: 1.0000 | ORAL_TABLET | Freq: Every evening | ORAL | 1 refills | Status: DC | PRN

## 2018-07-16 MED ORDER — LIP MEDEX EX OINT
TOPICAL_OINTMENT | CUTANEOUS | Status: AC
  Filled 2018-07-16: qty 7

## 2018-07-16 MED ORDER — IBUPROFEN 600 MG PO TABS: 600.0000 mg | ORAL_TABLET | Freq: Four times a day (QID) | ORAL | 0 refills | Status: DC | PRN

## 2018-07-16 NOTE — Progress Notes (Signed)
Discharge instructions and medications reviewed with patient. Questions answered and patient denies further questions. No prescriptions given to patient. Spouse is here to drive patient home. Donne Hazel, RN

## 2018-07-16 NOTE — Discharge Summary (Addendum)
Physician Discharge Summary  Patient ID: Katherine Frederick MRN: [id number] DOB/AGE: Dec 22, 1950 67 y.o.  Admit date: 07/15/2018 Discharge date: 07/16/2018  Admission Diagnoses: Endometrial cancer [facility])  Discharge Diagnoses:  Principal Problem:   Endometrial cancer ([place]) Active Problems:   Type 2 diabetes mellitus with stage 1 chronic kidney disease, without long-term current use of insulin ([facility])   Vulvar lesion   Discharged Condition:  The patient is in good condition and stable for discharge.    Hospital Course: On 07/15/2018, the patient underwent the following: Procedure(s): XI ROBOTIC ASSISTED TOTAL HYSTERECTOMY WITH BILATERAL SALPINGO OOPHORECTOMY WITH SENTINEL LYMPH NODE BIOPSY AND VULVAR BIOPSY VULVAR BIOPSY.  The postoperative course was uneventful.  She was discharged to home on postoperative day 1 tolerating a regular diet, ambulating, voiding, pain minimal.  Consults: None  Significant Diagnostic Studies: None  Treatments: surgery: see above  Discharge Exam: Blood pressure (!) 124/56, pulse (!) 59, temperature 98.7 F (37.1 C), temperature source Oral, resp. rate 16, height 5\' 5"  (1.651 m), weight 215 lb 6.2 oz (97.7 kg), last menstrual period 02/24/2012, SpO2 96 %. General appearance: alert, cooperative and no distress Resp: clear to auscultation bilaterally Cardio: regular rate and rhythm, S1, S2 normal, no murmur, click, rub or gallop GI: soft, non-tender; bowel sounds normal; no masses,  no organomegaly Extremities: extremities normal, atraumatic, no cyanosis or edema Incision/Wound: Lap sites to the abdomen with ecchymosis present.  No active drainage.  Disposition: Discharge disposition: 01-Home or Self Care       Discharge Instructions    Call MD for:  difficulty breathing, headache or visual disturbances   Complete by:  As directed    Call MD for:  extreme fatigue   Complete by:  As directed    Call MD for:  hives   Complete by:  As directed    Call MD  for:  persistant dizziness or light-headedness   Complete by:  As directed    Call MD for:  persistant nausea and vomiting   Complete by:  As directed    Call MD for:  redness, tenderness, or signs of infection (pain, swelling, redness, odor or green/yellow discharge around incision site)   Complete by:  As directed    Call MD for:  severe uncontrolled pain   Complete by:  As directed    Call MD for:  temperature >100.4   Complete by:  As directed    Diet - low sodium heart healthy   Complete by:  As directed    Driving Restrictions   Complete by:  As directed    No driving for 1 week.  Do not take narcotics and drive.   Increase activity slowly   Complete by:  As directed    Lifting restrictions   Complete by:  As directed    No lifting greater than 10 lbs.   Sexual Activity Restrictions   Complete by:  As directed    No sexual activity, nothing in the vagina, for 8 weeks.     Allergies as of 07/16/2018      Reactions   Lisinopril Cough   Macrodantin Nausea Only, Other (See Comments)   Abdominal pains      Medication List    STOP taking these medications   medroxyPROGESTERone 10 MG tablet Commonly known as:  PROVERA     TAKE these medications   acetaminophen 500 MG tablet Commonly known as:  TYLENOL Take 1,000 mg by mouth every 6 (six) hours as needed (for pain.).  aspirin EC 81 MG tablet Take 81 mg by mouth at bedtime.   CALCIUM 600+D3 PO Take 1 tablet by mouth daily.   clobetasol ointment 0.05 % Commonly known as:  TEMOVATE Apply 1 application topically 2 (two) times daily.   fluocinonide 0.05 % external solution Commonly known as:  LIDEX Apply 1 application topically daily as needed (for itching/irritation.).   hydrochlorothiazide 25 MG tablet Commonly known as:  HYDRODIURIL Take 12.5 mg by mouth daily.   ibuprofen 600 MG tablet Commonly known as:  ADVIL,MOTRIN Take 1 tablet (600 mg total) by mouth every 6 (six) hours as needed.   losartan 100 MG  tablet Commonly known as:  COZAAR Take 100 mg by mouth daily.   metoprolol tartrate 50 MG tablet Commonly known as:  LOPRESSOR Take 50 mg by mouth 2 (two) times daily.   multivitamin with minerals Tabs tablet Take 1 tablet by mouth at bedtime.   oxyCODONE 5 MG immediate release tablet Commonly known as:  Oxy IR/ROXICODONE Take 1 tablet (5 mg total) by mouth every 6 (six) hours as needed for moderate pain or breakthrough pain.   senna 8.6 MG Tabs tablet Commonly known as:  SENOKOT Take 1 tablet (8.6 mg total) by mouth at bedtime as needed for mild constipation.   simvastatin 20 MG tablet Commonly known as:  ZOCOR Take 20 mg by mouth every evening.   Vitamin D (Ergocalciferol) 50000 units Caps capsule Commonly known as:  DRISDOL Take 50,000 Units by mouth every Monday.      Follow-up Information    Everitt Amber, MD In 4 weeks.   Specialty:  Gynecologic Oncology Contact information: [place] [address] [phone]           Greater than thirty minutes were spend for face to face discharge instructions and discharge orders/summary in EPIC.   Signed: Dorothyann Gibbs 07/16/2018, 9:49 AM

## 2018-07-16 NOTE — Discharge Instructions (Addendum)
07/15/2018  Return to work: 4 weeks  Activity: 1. Be up and out of the bed during the day.  Take a nap if needed.  You may walk up steps but be careful and use the hand rail.  Stair climbing will tire you more than you think, you may need to stop part way and rest.   2. No lifting or straining for 6 weeks.  3. No driving for 1 weeks.  Do Not drive if you are taking narcotic pain medicine.  4. Shower daily.  Use soap and water on your incision and pat dry; don't rub.   5. No sexual activity and nothing in the vagina for 8 weeks.  6. You can resume your aspirin one week after surery.  Medications:  - Take ibuprofen and tylenol first line for pain control. Take these regularly (every 6 hours) to decrease the build up of pain.  - If necessary, for severe pain not relieved by ibuprofen, take oxycodone.  - While taking oxycodone you should take sennakot every night to reduce the likelihood of constipation. If this causes diarrhea, stop its use.  Diet: 1. Low sodium Heart Healthy Diet is recommended.  2. It is safe to use a laxative if you have difficulty moving your bowels.   Wound Care: 1. Keep clean and dry.  Shower daily.  Reasons to call the Doctor:   Fever - Oral temperature greater than 100.4 degrees Fahrenheit  Foul-smelling vaginal discharge  Difficulty urinating  Nausea and vomiting  Increased pain at the site of the incision that is unrelieved with pain medicine.  Difficulty breathing with or without chest pain  New calf pain especially if only on one side  Sudden, continuing increased vaginal bleeding with or without clots.   Follow-up: 1. See Everitt Amber in 4 weeks.  Contacts: For questions or concerns you should contact:  Dr. Everitt Amber at [phone]  After hours and on week-ends call [phone] and ask to speak to the physician on call for Gynecologic Oncology  Oxycodone tablets or capsules What is this medicine? OXYCODONE (ox i KOE done) is a  pain reliever. It is used to treat moderate to severe pain. This medicine may be used for other purposes; ask your health care provider or pharmacist if you have questions. COMMON BRAND NAME(S): Dazidox, Endocodone, Oxaydo, OXECTA, OxyIR, Percolone, Roxicodone, ROXYBOND What should I tell my health care provider before I take this medicine? They need to know if you have any of these conditions: -Addison's disease -brain tumor -head injury -heart disease -history of drug or alcohol abuse problem -if you often drink alcohol -kidney disease -liver disease -lung or breathing disease, like asthma -mental illness -pancreatic disease -seizures -thyroid disease -an unusual or allergic reaction to oxycodone, codeine, hydrocodone, morphine, other medicines, foods, dyes, or preservatives -pregnant or trying to get pregnant -breast-feeding How should I use this medicine? Take this medicine by mouth with a glass of water. Follow the directions on the prescription label. You can take it with or without food. If it upsets your stomach, take it with food. Take your medicine at regular intervals. Do not take it more often than directed. Do not stop taking except on your doctor's advice. Some brands of this medicine, like Oxecta, have special instructions. Ask your doctor or pharmacist if these directions are for you: Do not cut, crush or chew this medicine. Swallow only one tablet at a time. Do not wet, soak, or lick the tablet before you take  it. A special MedGuide will be given to you by the pharmacist with each prescription and refill. Be sure to read this information carefully each time. Talk to your pediatrician regarding the use of this medicine in children. Special care may be needed. Overdosage: If you think you have taken too much of this medicine contact a poison control center or emergency room at once. NOTE: This medicine is only for you. Do not share this medicine with others. What if I miss  a dose? If you miss a dose, take it as soon as you can. If it is almost time for your next dose, take only that dose. Do not take double or extra doses. What may interact with this medicine? This medicine may interact with the following medications: -alcohol -antihistamines for allergy, cough and cold -antiviral medicines for HIV or AIDS -atropine -certain antibiotics like clarithromycin, erythromycin, linezolid, rifampin -certain medicines for anxiety or sleep -certain medicines for bladder problems like oxybutynin, tolterodine -certain medicines for depression like amitriptyline, fluoxetine, sertraline -certain medicines for fungal infections like ketoconazole, itraconazole, voriconazole -certain medicines for migraine headache like almotriptan, eletriptan, frovatriptan, naratriptan, rizatriptan, sumatriptan, zolmitriptan -certain medicines for nausea or vomiting like dolasetron, ondansetron, palonosetron -certain medicines for Parkinson's disease like benztropine, trihexyphenidyl -certain medicines for seizures like phenobarbital, phenytoin, primidone -certain medicines for stomach problems like dicyclomine, hyoscyamine -certain medicines for travel sickness like scopolamine -diuretics -general anesthetics like halothane, isoflurane, methoxyflurane, propofol -ipratropium -local anesthetics like lidocaine, pramoxine, tetracaine -MAOIs like Carbex, Eldepryl, Marplan, Nardil, and Parnate -medicines that relax muscles for surgery -methylene blue -nilotinib -other narcotic medicines for pain or cough -phenothiazines like chlorpromazine, mesoridazine, prochlorperazine, thioridazine This list may not describe all possible interactions. Give your health care provider a list of all the medicines, herbs, non-prescription drugs, or dietary supplements you use. Also tell them if you smoke, drink alcohol, or use illegal drugs. Some items may interact with your medicine. What should I watch for  while using this medicine? Tell your doctor or health care professional if your pain does not go away, if it gets worse, or if you have new or a different type of pain. You may develop tolerance to the medicine. Tolerance means that you will need a higher dose of the medicine for pain relief. Tolerance is normal and is expected if you take this medicine for a long time. Do not suddenly stop taking your medicine because you may develop a severe reaction. Your body becomes used to the medicine. This does NOT mean you are addicted. Addiction is a behavior related to getting and using a drug for a non-medical reason. If you have pain, you have a medical reason to take pain medicine. Your doctor will tell you how much medicine to take. If your doctor wants you to stop the medicine, the dose will be slowly lowered over time to avoid any side effects. There are different types of narcotic medicines (opiates). If you take more than one type at the same time or if you are taking another medicine that also causes drowsiness, you may have more side effects. Give your health care provider a list of all medicines you use. Your doctor will tell you how much medicine to take. Do not take more medicine than directed. Call emergency for help if you have problems breathing or unusual sleepiness. You may get drowsy or dizzy. Do not drive, use machinery, or do anything that needs mental alertness until you know how the medicine affects you. Do not stand or  sit up quickly, especially if you are an older patient. This reduces the risk of dizzy or fainting spells. Alcohol may interfere with the effect of this medicine. Avoid alcoholic drinks. This medicine will cause constipation. Try to have a bowel movement at least every 2 to 3 days. If you do not have a bowel movement for 3 days, call your doctor or health care professional. Your mouth may get dry. Chewing sugarless gum or sucking hard candy, and drinking plenty of water may  help. Contact your doctor if the problem does not go away or is severe. What side effects may I notice from receiving this medicine? Side effects that you should report to your doctor or health care professional as soon as possible: -allergic reactions like skin rash, itching or hives, swelling of the face, lips, or tongue -breathing problems -confusion -signs and symptoms of low blood pressure like dizziness; feeling faint or lightheaded, falls; unusually weak or tired -trouble passing urine or change in the amount of urine -trouble swallowing Side effects that usually do not require medical attention (report to your doctor or health care professional if they continue or are bothersome): -constipation -dry mouth -nausea, vomiting -tiredness This list may not describe all possible side effects. Call your doctor for medical advice about side effects. You may report side effects to FDA at 1-800-FDA-1088. Where should I keep my medicine? Keep out of the reach of children. This medicine can be abused. Keep your medicine in a safe place to protect it from theft. Do not share this medicine with anyone. Selling or giving away this medicine is dangerous and against the law. Store at room temperature between 15 and 30 degrees C (59 and 86 degrees F). Protect from light. Keep container tightly closed. This medicine may cause accidental overdose and death if it is taken by other adults, children, or pets. Flush any unused medicine down the toilet to reduce the chance of harm. Do not use the medicine after the expiration date. NOTE: This sheet is a summary. It may not cover all possible information. If you have questions about this medicine, talk to your doctor, pharmacist, or health care provider.  2018 Elsevier/Gold Standard (2015-10-10 16:55:57)

## 2018-07-16 NOTE — Anesthesia Postprocedure Evaluation (Signed)
Anesthesia Post Note  Patient: Katherine Frederick  Procedure(s) Performed: XI ROBOTIC ASSISTED TOTAL HYSTERECTOMY WITH BILATERAL SALPINGO OOPHORECTOMY WITH SENTINEL LYMPH NODE BIOPSY AND VULVAR BIOPSY (Bilateral ) VULVAR BIOPSY (N/A )     Patient location during evaluation: PACU Anesthesia Type: General Level of consciousness: awake and alert Pain management: pain level controlled Vital Signs Assessment: post-procedure vital signs reviewed and stable Respiratory status: spontaneous breathing, nonlabored ventilation, respiratory function stable and patient connected to nasal cannula oxygen Cardiovascular status: blood pressure returned to baseline and stable Postop Assessment: no apparent nausea or vomiting Anesthetic complications: no    Last Vitals:  Vitals:   07/16/18 0200 07/16/18 0500  BP: 136/62 (!) 124/56  Pulse: 69 (!) 59  Resp: 16 16  Temp: 36.8 C 37.1 C  SpO2: 98% 96%    Last Pain:  Vitals:   07/16/18 0500  TempSrc: Oral  PainSc:                  [name] DAVID

## 2018-07-21 ENCOUNTER — Telehealth: Payer: Self-pay | Admitting: Gynecologic Oncology

## 2018-07-21 NOTE — Telephone Encounter (Signed)
Post op telephone call to check patient status.  Patient describes expected post operative status.  Adequate PO intake reported.  Bowels and bladder functioning without difficulty.  Pain minimal.  Reportable signs and symptoms reviewed.  [place] the beach doing [place].  Final path discussed.  Advised to call for any needs.  Advised to follow up as scheduled or sooner if needed.

## 2018-08-05 ENCOUNTER — Encounter: Payer: Self-pay | Admitting: Gynecologic Oncology

## 2018-08-05 ENCOUNTER — Inpatient Hospital Stay: Payer: Medicare Other | Attending: Gynecologic Oncology | Admitting: Gynecologic Oncology

## 2018-08-05 VITALS — BP 139/59 | HR 53 | Temp 98.5°F | Resp 20 | Ht 65.0 in | Wt 206.9 lb

## 2018-08-05 DIAGNOSIS — Z90722 Acquired absence of ovaries, bilateral: Secondary | ICD-10-CM

## 2018-08-05 DIAGNOSIS — Z90710 Acquired absence of both cervix and uterus: Secondary | ICD-10-CM

## 2018-08-05 DIAGNOSIS — C541 Malignant neoplasm of endometrium: Secondary | ICD-10-CM | POA: Insufficient documentation

## 2018-08-05 NOTE — Progress Notes (Signed)
Follow-up Note: Gyn-Onc  Consult was requested by Dr. Debroah Baller, NP for the evaluation of Katherine Frederick 67 y.o. female  CC:  Chief Complaint  Patient presents with  . Endometrial cancer [facility])    Assessment/Plan:  Katherine Frederick  is a 67 y.o.  year old with a history of stage IA grade 1 endometrial cancer, MMR normal.  Pathology revealed low risk factors for recurrence, therefore no adjuvant therapy is recommended according to NCCN guidelines.  I discussed risk for recurrence and typical symptoms encouraged her to notify us of these should they develop between visits.  I recommend she have follow-up every 6 months for 5 years in accordance with NCCN guidelines. Those visits should include symptom assessment, physical exam and pelvic examination. Pap smears are not indicated or recommended in the routine surveillance of endometrial cancer.  She will follow-up with me in 6 months and Auma Thongteum in 12 months   HPI: [name] is a very pleasant 67 year old P2 who is seen in consultation at the request of Dr Nelda Marseille and Delice Bison NP for grade 1 endometrioid endometrial cancer.  The patient has a history of postmenopausal bleeding since approximately 2011.  She has had intermittent biopsies and D&Cs which had always returned as benign polyps.  She had a new episode of bright red vaginal bleeding that was somewhat different in characteristic in June 2019.  This prompted her to be seen by Delice Bison who performed a transvaginal ultrasound scan on June 18, 2018 which revealed a uterus measuring 4.3 x 7.5 x 4.8 cm with an endometrial thickness of 1.6 cm.  The ovaries were grossly normal bilaterally.  An endometrial Pipelle biopsy was then performed on the same day which revealed endometrioid adenocarcinoma FIGO grade 1 arising the background of complex atypical hyperplasia.  Patient otherwise carries a medical history of diet-controlled prediabetes with most  recent HbA1c of 6%.  She has a history of hypertension hypercholesterolemia and vitamin D deficiency.  She has a history of psoriasis.  She has had 2 prior cesarean sections and laparoscopic cholecystectomy laparoscopic tubal ligation but no other abdominal surgeries.  She takes 81 mg of aspirin daily for general cardiovascular health.  Her family history is not remarkable for colorectal, uterine, ovary, or breast cancer.  She also reports symptoms of a lesion that has been followed and somewhat stable for approximately 6 years on the vulva which is somewhat whitish in appearance she takes clobetasol for this.  It is never been biopsied.  Interval Hx:  On 07/15/18 she underwent robotic assisted total hysterectomy, BSO, SLN biopsy.  Surgery was uncomplicated.  Final pathology revealed a 2.5cm grade 1 endometrioid tumor with no myometrial invasion. There were negative bilateral SLN's. Perineum biopsy was negative.  She was determined to have low risk features and therefore no adjuvant therapy was recommended in accordance with NCCN guidelines.   Current Meds:  Outpatient Encounter Medications as of 08/05/2018  Medication Sig  . acetaminophen (TYLENOL) 500 MG tablet Take 1,000 mg by mouth every 6 (six) hours as needed (for pain.).  Marland Kitchen aspirin EC 81 MG tablet Take 81 mg by mouth at bedtime.  . Calcium Carb-Cholecalciferol (CALCIUM 600+D3 PO) Take 1 tablet by mouth daily.  . fluocinonide (LIDEX) 0.05 % external solution Apply 1 application topically daily as needed (for itching/irritation.).   Marland Kitchen hydrochlorothiazide (HYDRODIURIL) 25 MG tablet Take 12.5 mg by mouth daily.   Marland Kitchen ibuprofen (ADVIL,MOTRIN) 600 MG tablet Take 1 tablet (  600 mg total) by mouth every 6 (six) hours as needed.  Marland Kitchen losartan (COZAAR) 100 MG tablet Take 100 mg by mouth daily.  . metoprolol (LOPRESSOR) 50 MG tablet Take 50 mg by mouth 2 (two) times daily.  . Multiple Vitamin (MULITIVITAMIN WITH MINERALS) TABS Take 1 tablet by mouth at  bedtime.   . senna (SENOKOT) 8.6 MG TABS tablet Take 1 tablet (8.6 mg total) by mouth at bedtime as needed for mild constipation.  . simvastatin (ZOCOR) 20 MG tablet Take 20 mg by mouth every evening.  . Vitamin D, Ergocalciferol, (DRISDOL) 50000 UNITS CAPS Take 50,000 Units by mouth every Monday.   . [DISCONTINUED] clobetasol ointment (TEMOVATE) 3.00 % Apply 1 application topically 2 (two) times daily.   . [DISCONTINUED] oxyCODONE (OXY IR/ROXICODONE) 5 MG immediate release tablet Take 1 tablet (5 mg total) by mouth every 6 (six) hours as needed for moderate pain or breakthrough pain. (Patient not taking: Reported on 08/05/2018)   No facility-administered encounter medications on file as of 08/05/2018.     Allergy:  Allergies  Allergen Reactions  . Lisinopril Cough  . Macrodantin Nausea Only and Other (See Comments)    Abdominal pains    Social Hx:   Social History   Socioeconomic History  . Marital status: Widowed    Spouse name: Not on file  . Number of children: Not on file  . Years of education: Not on file  . Highest education level: Not on file  Occupational History  . Not on file  Social Needs  . Financial resource strain: Not on file  . Food insecurity:    Worry: Not on file    Inability: Not on file  . Transportation needs:    Medical: Not on file    Non-medical: Not on file  Tobacco Use  . Smoking status: Never Smoker  . Smokeless tobacco: Never Used  Substance and Sexual Activity  . Alcohol use: No  . Drug use: No  . Sexual activity: Yes    Birth control/protection: Surgical  Lifestyle  . Physical activity:    Days per week: Not on file    Minutes per session: Not on file  . Stress: Not on file  Relationships  . Social connections:    Talks on phone: Not on file    Gets together: Not on file    Attends religious service: Not on file    Active member of club or organization: Not on file    Attends meetings of clubs or organizations: Not on file     Relationship status: Not on file  . Intimate partner violence:    Fear of current or ex partner: Not on file    Emotionally abused: Not on file    Physically abused: Not on file    Forced sexual activity: Not on file  Other Topics Concern  . Not on file  Social History Narrative  . Not on file    Past Surgical Hx:  Past Surgical History:  Procedure Laterality Date  . ABDOMINAL HYSTERECTOMY     laparoscopic Dr. Denman George 07-15-18  . CESAREAN SECTION     x 2  . CHOLECYSTECTOMY    . COLONOSCOPY  2016   x 3  . DILATATION & CURETTAGE/HYSTEROSCOPY WITH MYOSURE N/A 02/28/2016   Procedure: DILATATION & CURETTAGE/HYSTEROSCOPY WITH MYOSURE;  Surgeon: Janyth Pupa, DO;  Location: [facility];  Service: Gynecology;  Laterality: N/A;  Polypectomy  . HYSTEROSCOPY W/D&C  02/27/2012   Procedure: DILATATION AND  CURETTAGE /HYSTEROSCOPY;  Surgeon: Olga Millers, MD;  Location: [facility];  Service: Gynecology;  Laterality: N/A;  . ROBOTIC ASSISTED TOTAL HYSTERECTOMY WITH BILATERAL SALPINGO OOPHERECTOMY Bilateral 07/15/2018   Procedure: XI ROBOTIC ASSISTED TOTAL HYSTERECTOMY WITH BILATERAL SALPINGO OOPHORECTOMY WITH SENTINEL LYMPH NODE BIOPSY AND VULVAR BIOPSY;  Surgeon: Everitt Amber, MD;  Location: [facility];  Service: Gynecology;  Laterality: Bilateral;  . TUBAL LIGATION    . VULVA Milagros Loll BIOPSY N/A 07/15/2018   Procedure: VULVAR BIOPSY;  Surgeon: Everitt Amber, MD;  Location: [facility];  Service: Gynecology;  Laterality: N/A;  . WISDOM TOOTH EXTRACTION      Past Medical Hx:  Past Medical History:  Diagnosis Date  . Arthritis    knees  . [facility])    endometrial cancer   . Hypercholesteremia   . Hyperlipidemia   . Hypertension   . Post-menopausal bleeding   . Pre-diabetes    no meds  . Psoriasis     Past Gynecological History:  C/s x 2, no history of abn paps (last pap in July, 2019 with normal cytology and negative high risk HPV).  Patient's last menstrual period was 02/24/2012.  Family Hx:  Family  History  Problem Relation Age of Onset  . Stomach cancer Mother   . Stroke Father   . Heart attack Father   . Congestive Heart Failure Father   . Alzheimer's disease Sister   . Esophageal cancer Brother   . Hypertension Brother   . Hypertension Brother   . Hypertension Brother   . Anesthesia problems Neg Hx   . Hypotension Neg Hx   . Malignant hyperthermia Neg Hx   . Pseudochol deficiency Neg Hx   . Breast cancer Neg Hx     Review of Systems:  Constitutional  Feels well,    ENT Normal appearing ears and nares bilaterally Skin/Breast  No rash, sores, jaundice, itching, dryness Cardiovascular  No chest pain, shortness of breath, or edema  Pulmonary  No cough or wheeze.  Gastro Intestinal  No nausea, vomitting, or diarrhoea. No bright red blood per rectum, no abdominal pain, change in bowel movement, or constipation.  Genito Urinary  No frequency, urgency, dysuria, no bleeding Musculo Skeletal  No myalgia, arthralgia, joint swelling or pain  Neurologic  No weakness, numbness, change in gait,  Psychology  No depression, anxiety, insomnia.   Vitals:  Blood pressure (!) 139/59, pulse (!) 53, temperature 98.5 F (36.9 C), temperature source Oral, resp. rate 20, height 5' 5" (1.651 m), weight 206 lb 14.4 oz (93.8 kg), last menstrual period 02/24/2012, SpO2 100 %.  Physical Exam: WD in NAD Neck  Supple NROM, without any enlargements.  Lymph Node Survey No cervical supraclavicular or inguinal adenopathy Cardiovascular  Pulse normal rate, regularity and rhythm. S1 and S2 normal.  Lungs  Clear to auscultation bilateraly, without wheezes/crackles/rhonchi. Good air movement.  Skin  No rash/lesions/breakdown  Psychiatry  Alert and oriented to person, place, and time  Abdomen  Normoactive bowel sounds, abdomen soft, non-tender and obese without evidence of hernia. Incisions well healed.  Back No CVA tenderness Genito Urinary  Vaginal cuff well healed, no masses, no  lesions, no bleeding. Perineum in tact and healed. Rectal  deferred  Extremities  No bilateral cyanosis, clubbing or edema.   20 minutes of direct face to face counseling time was spent with the patient. This included discussion about prognosis, therapy recommendations and postoperative side effects and are beyond the scope of routine postoperative care.   Terrence Dupont  Pamella Pert, MD  08/05/2018, 10:55 AM

## 2018-08-05 NOTE — Patient Instructions (Signed)
Please notify Dr Denman George at phone number [phone] if you notice vaginal bleeding, new pelvic or abdominal pains, bloating, feeling full easy, or a change in bladder or bowel function.   Please contact Dr Serita Grit office in November at the above number to schedule follow-up with her in February, 2020.

## 2018-08-12 DIAGNOSIS — H43812 Vitreous degeneration, left eye: Secondary | ICD-10-CM | POA: Diagnosis not present

## 2018-10-06 DIAGNOSIS — Z23 Encounter for immunization: Secondary | ICD-10-CM | POA: Diagnosis not present

## 2018-10-06 DIAGNOSIS — I10 Essential (primary) hypertension: Secondary | ICD-10-CM | POA: Diagnosis not present

## 2018-10-06 DIAGNOSIS — E119 Type 2 diabetes mellitus without complications: Secondary | ICD-10-CM | POA: Diagnosis not present

## 2018-10-06 DIAGNOSIS — E7800 Pure hypercholesterolemia, unspecified: Secondary | ICD-10-CM | POA: Diagnosis not present

## 2018-10-06 DIAGNOSIS — E559 Vitamin D deficiency, unspecified: Secondary | ICD-10-CM | POA: Diagnosis not present

## 2018-10-06 DIAGNOSIS — Z0000 Encounter for general adult medical examination without abnormal findings: Secondary | ICD-10-CM | POA: Diagnosis not present

## 2018-10-13 ENCOUNTER — Other Ambulatory Visit: Payer: Self-pay | Admitting: Family Medicine

## 2018-10-13 ENCOUNTER — Telehealth: Payer: Self-pay | Admitting: *Deleted

## 2018-10-13 DIAGNOSIS — Z1231 Encounter for screening mammogram for malignant neoplasm of breast: Secondary | ICD-10-CM

## 2018-10-13 NOTE — Telephone Encounter (Signed)
Patient called and scheduled an appt for February  

## 2018-11-25 ENCOUNTER — Ambulatory Visit
Admission: RE | Admit: 2018-11-25 | Discharge: 2018-11-25 | Disposition: A | Discharge status: Home / Self Care | Payer: Medicare Other | Source: Ambulatory Visit | Attending: Family Medicine | Admitting: Family Medicine

## 2018-11-25 DIAGNOSIS — Z1231 Encounter for screening mammogram for malignant neoplasm of breast: Secondary | ICD-10-CM | POA: Diagnosis not present

## 2018-11-26 ENCOUNTER — Other Ambulatory Visit: Payer: Self-pay | Admitting: Family Medicine

## 2018-11-26 DIAGNOSIS — R928 Other abnormal and inconclusive findings on diagnostic imaging of breast: Secondary | ICD-10-CM

## 2018-12-03 ENCOUNTER — Ambulatory Visit
Admission: RE | Admit: 2018-12-03 | Discharge: 2018-12-03 | Disposition: A | Discharge status: Home / Self Care | Payer: Medicare Other | Source: Ambulatory Visit | Attending: Family Medicine | Admitting: Family Medicine

## 2018-12-03 ENCOUNTER — Other Ambulatory Visit: Payer: Self-pay | Admitting: Family Medicine

## 2018-12-03 DIAGNOSIS — R928 Other abnormal and inconclusive findings on diagnostic imaging of breast: Secondary | ICD-10-CM

## 2018-12-03 DIAGNOSIS — N6489 Other specified disorders of breast: Secondary | ICD-10-CM | POA: Diagnosis not present

## 2018-12-11 ENCOUNTER — Ambulatory Visit
Admission: RE | Admit: 2018-12-11 | Discharge: 2018-12-11 | Disposition: A | Discharge status: Home / Self Care | Payer: Medicare Other | Source: Ambulatory Visit | Attending: Family Medicine | Admitting: Family Medicine

## 2018-12-11 DIAGNOSIS — N6489 Other specified disorders of breast: Secondary | ICD-10-CM | POA: Diagnosis not present

## 2018-12-11 DIAGNOSIS — N6322 Unspecified lump in the left breast, upper inner quadrant: Secondary | ICD-10-CM | POA: Diagnosis not present

## 2018-12-11 DIAGNOSIS — R928 Other abnormal and inconclusive findings on diagnostic imaging of breast: Secondary | ICD-10-CM

## 2018-12-23 DIAGNOSIS — J069 Acute upper respiratory infection, unspecified: Secondary | ICD-10-CM | POA: Diagnosis not present

## 2018-12-23 DIAGNOSIS — Z20828 Contact with and (suspected) exposure to other viral communicable diseases: Secondary | ICD-10-CM | POA: Diagnosis not present

## 2019-01-13 ENCOUNTER — Other Ambulatory Visit: Payer: Self-pay | Admitting: General Surgery

## 2019-01-13 ENCOUNTER — Encounter (HOSPITAL_BASED_OUTPATIENT_CLINIC_OR_DEPARTMENT_OTHER): Payer: Self-pay | Admitting: *Deleted

## 2019-01-13 ENCOUNTER — Other Ambulatory Visit: Payer: Self-pay

## 2019-01-13 DIAGNOSIS — N6489 Other specified disorders of breast: Secondary | ICD-10-CM

## 2019-01-14 ENCOUNTER — Encounter (HOSPITAL_BASED_OUTPATIENT_CLINIC_OR_DEPARTMENT_OTHER)
Admission: RE | Admit: 2019-01-14 | Discharge: 2019-01-14 | Disposition: A | Discharge status: Home / Self Care | Payer: Medicare Other | Source: Ambulatory Visit | Attending: General Surgery | Admitting: General Surgery

## 2019-01-14 DIAGNOSIS — N6092 Unspecified benign mammary dysplasia of left breast: Secondary | ICD-10-CM | POA: Diagnosis not present

## 2019-01-14 DIAGNOSIS — L905 Scar conditions and fibrosis of skin: Secondary | ICD-10-CM | POA: Diagnosis not present

## 2019-01-14 DIAGNOSIS — Z01812 Encounter for preprocedural laboratory examination: Secondary | ICD-10-CM | POA: Insufficient documentation

## 2019-01-14 DIAGNOSIS — Z859 Personal history of malignant neoplasm, unspecified: Secondary | ICD-10-CM | POA: Diagnosis not present

## 2019-01-14 DIAGNOSIS — E119 Type 2 diabetes mellitus without complications: Secondary | ICD-10-CM | POA: Diagnosis not present

## 2019-01-14 DIAGNOSIS — Z888 Allergy status to other drugs, medicaments and biological substances status: Secondary | ICD-10-CM | POA: Diagnosis not present

## 2019-01-14 DIAGNOSIS — Z86010 Personal history of colonic polyps: Secondary | ICD-10-CM | POA: Diagnosis not present

## 2019-01-14 DIAGNOSIS — E7800 Pure hypercholesterolemia, unspecified: Secondary | ICD-10-CM | POA: Diagnosis not present

## 2019-01-14 DIAGNOSIS — I10 Essential (primary) hypertension: Secondary | ICD-10-CM | POA: Diagnosis not present

## 2019-01-14 DIAGNOSIS — Z6837 Body mass index (BMI) 37.0-37.9, adult: Secondary | ICD-10-CM | POA: Diagnosis not present

## 2019-01-14 DIAGNOSIS — M1990 Unspecified osteoarthritis, unspecified site: Secondary | ICD-10-CM | POA: Diagnosis not present

## 2019-01-14 DIAGNOSIS — Z79899 Other long term (current) drug therapy: Secondary | ICD-10-CM | POA: Diagnosis not present

## 2019-01-14 LAB — BASIC METABOLIC PANEL
Anion gap: 14 (ref 5–15)
BUN: 15 mg/dL (ref 8–23)
CO2: 28 mmol/L (ref 22–32)
Calcium: 9.3 mg/dL (ref 8.9–10.3)
Chloride: 100 mmol/L (ref 98–111)
Creatinine, Ser: 0.8 mg/dL (ref 0.44–1.00)
GFR calc Af Amer: >60 mL/min (ref >60)
GFR calc non Af Amer: >60 mL/min (ref >60)
Glucose, Bld: 105 mg/dL — ABNORMAL HIGH (ref 70–99)
Potassium: 4 mmol/L (ref 3.5–5.1)
Sodium: 142 mmol/L (ref 135–145)

## 2019-01-14 MED ORDER — ENSURE PRE-SURGERY PO LIQD: 296.0000 mL | Freq: Once | ORAL | Status: DC

## 2019-01-14 NOTE — Progress Notes (Signed)
Ensure pre surgery drink given with instructions to complete by 1130 dos, surgical soap given with instructions, pt verbalized understanding. 

## 2019-01-18 ENCOUNTER — Ambulatory Visit
Admission: RE | Admit: 2019-01-18 | Discharge: 2019-01-18 | Disposition: A | Discharge status: Home / Self Care | Payer: Medicare Other | Source: Ambulatory Visit | Attending: General Surgery | Admitting: General Surgery

## 2019-01-18 DIAGNOSIS — R928 Other abnormal and inconclusive findings on diagnostic imaging of breast: Secondary | ICD-10-CM | POA: Diagnosis not present

## 2019-01-18 DIAGNOSIS — H52203 Unspecified astigmatism, bilateral: Secondary | ICD-10-CM | POA: Diagnosis not present

## 2019-01-18 DIAGNOSIS — N6489 Other specified disorders of breast: Secondary | ICD-10-CM

## 2019-01-18 DIAGNOSIS — H25011 Cortical age-related cataract, right eye: Secondary | ICD-10-CM | POA: Diagnosis not present

## 2019-01-18 DIAGNOSIS — H2512 Age-related nuclear cataract, left eye: Secondary | ICD-10-CM | POA: Diagnosis not present

## 2019-01-18 DIAGNOSIS — E119 Type 2 diabetes mellitus without complications: Secondary | ICD-10-CM | POA: Diagnosis not present

## 2019-01-19 ENCOUNTER — Ambulatory Visit (HOSPITAL_BASED_OUTPATIENT_CLINIC_OR_DEPARTMENT_OTHER)
Admission: RE | Admit: 2019-01-19 | Discharge: 2019-01-19 | Disposition: A | Discharge status: Home / Self Care | Payer: Medicare Other | Attending: General Surgery | Admitting: General Surgery

## 2019-01-19 ENCOUNTER — Ambulatory Visit (HOSPITAL_BASED_OUTPATIENT_CLINIC_OR_DEPARTMENT_OTHER): Payer: Medicare Other | Admitting: Anesthesiology

## 2019-01-19 ENCOUNTER — Ambulatory Visit
Admission: RE | Admit: 2019-01-19 | Discharge: 2019-01-19 | Disposition: A | Discharge status: Home / Self Care | Payer: Medicare Other | Source: Ambulatory Visit | Attending: General Surgery | Admitting: General Surgery

## 2019-01-19 ENCOUNTER — Encounter (HOSPITAL_BASED_OUTPATIENT_CLINIC_OR_DEPARTMENT_OTHER)
Admission: RE | Disposition: A | Discharge status: Home / Self Care | Payer: Self-pay | Source: Home / Self Care | Attending: General Surgery

## 2019-01-19 ENCOUNTER — Other Ambulatory Visit: Payer: Self-pay

## 2019-01-19 ENCOUNTER — Encounter (HOSPITAL_BASED_OUTPATIENT_CLINIC_OR_DEPARTMENT_OTHER): Payer: Self-pay | Admitting: *Deleted

## 2019-01-19 DIAGNOSIS — Z888 Allergy status to other drugs, medicaments and biological substances status: Secondary | ICD-10-CM | POA: Insufficient documentation

## 2019-01-19 DIAGNOSIS — Z859 Personal history of malignant neoplasm, unspecified: Secondary | ICD-10-CM | POA: Diagnosis not present

## 2019-01-19 DIAGNOSIS — N6092 Unspecified benign mammary dysplasia of left breast: Secondary | ICD-10-CM | POA: Insufficient documentation

## 2019-01-19 DIAGNOSIS — Z79899 Other long term (current) drug therapy: Secondary | ICD-10-CM | POA: Diagnosis not present

## 2019-01-19 DIAGNOSIS — E1122 Type 2 diabetes mellitus with diabetic chronic kidney disease: Secondary | ICD-10-CM | POA: Diagnosis not present

## 2019-01-19 DIAGNOSIS — N6489 Other specified disorders of breast: Secondary | ICD-10-CM | POA: Diagnosis not present

## 2019-01-19 DIAGNOSIS — E119 Type 2 diabetes mellitus without complications: Secondary | ICD-10-CM | POA: Diagnosis not present

## 2019-01-19 DIAGNOSIS — L905 Scar conditions and fibrosis of skin: Secondary | ICD-10-CM | POA: Diagnosis not present

## 2019-01-19 DIAGNOSIS — Z6837 Body mass index (BMI) 37.0-37.9, adult: Secondary | ICD-10-CM | POA: Diagnosis not present

## 2019-01-19 DIAGNOSIS — Z86010 Personal history of colonic polyps: Secondary | ICD-10-CM | POA: Insufficient documentation

## 2019-01-19 DIAGNOSIS — N181 Chronic kidney disease, stage 1: Secondary | ICD-10-CM | POA: Diagnosis not present

## 2019-01-19 DIAGNOSIS — E7800 Pure hypercholesterolemia, unspecified: Secondary | ICD-10-CM | POA: Diagnosis not present

## 2019-01-19 DIAGNOSIS — M1990 Unspecified osteoarthritis, unspecified site: Secondary | ICD-10-CM | POA: Diagnosis not present

## 2019-01-19 DIAGNOSIS — I10 Essential (primary) hypertension: Secondary | ICD-10-CM | POA: Diagnosis not present

## 2019-01-19 DIAGNOSIS — R928 Other abnormal and inconclusive findings on diagnostic imaging of breast: Secondary | ICD-10-CM | POA: Diagnosis not present

## 2019-01-19 DIAGNOSIS — I129 Hypertensive chronic kidney disease with stage 1 through stage 4 chronic kidney disease, or unspecified chronic kidney disease: Secondary | ICD-10-CM | POA: Diagnosis not present

## 2019-01-19 HISTORY — PX: RADIOACTIVE SEED GUIDED EXCISIONAL BREAST BIOPSY: SHX6490

## 2019-01-19 HISTORY — PX: BREAST EXCISIONAL BIOPSY: SUR124

## 2019-01-19 HISTORY — DX: Unspecified lump in the left breast, unspecified quadrant: N63.20

## 2019-01-19 SURGERY — RADIOACTIVE SEED GUIDED BREAST BIOPSY
Anesthesia: General | Site: Breast | Laterality: Left

## 2019-01-19 MED ORDER — KETOROLAC TROMETHAMINE 15 MG/ML IJ SOLN
INTRAMUSCULAR | Status: AC
  Filled 2019-01-19: qty 1

## 2019-01-19 MED ORDER — FENTANYL CITRATE (PF) 100 MCG/2ML IJ SOLN
INTRAMUSCULAR | Status: AC
  Filled 2019-01-19: qty 2

## 2019-01-19 MED ORDER — DEXAMETHASONE SODIUM PHOSPHATE 10 MG/ML IJ SOLN
INTRAMUSCULAR | Status: AC
  Filled 2019-01-19: qty 1

## 2019-01-19 MED ORDER — CEFAZOLIN SODIUM-DEXTROSE 2-4 GM/100ML-% IV SOLN
INTRAVENOUS | Status: AC
  Filled 2019-01-19: qty 100

## 2019-01-19 MED ORDER — GABAPENTIN 100 MG PO CAPS
ORAL_CAPSULE | ORAL | Status: AC
  Filled 2019-01-19: qty 1

## 2019-01-19 MED ORDER — LACTATED RINGERS IV SOLN
INTRAVENOUS | Status: DC
  Administered 2019-01-19: 14:00:00 via INTRAVENOUS

## 2019-01-19 MED ORDER — FENTANYL CITRATE (PF) 100 MCG/2ML IJ SOLN
50.0000 ug | INTRAMUSCULAR | Status: DC | PRN
  Administered 2019-01-19 (×2): 50 ug via INTRAVENOUS

## 2019-01-19 MED ORDER — ACETAMINOPHEN 500 MG PO TABS
1000.0000 mg | ORAL_TABLET | ORAL | Status: AC
  Administered 2019-01-19: 1000 mg via ORAL

## 2019-01-19 MED ORDER — GABAPENTIN 100 MG PO CAPS
100.0000 mg | ORAL_CAPSULE | ORAL | Status: AC
  Administered 2019-01-19: 100 mg via ORAL

## 2019-01-19 MED ORDER — MIDAZOLAM HCL 2 MG/2ML IJ SOLN
INTRAMUSCULAR | Status: AC
  Filled 2019-01-19: qty 2

## 2019-01-19 MED ORDER — ONDANSETRON HCL 4 MG/2ML IJ SOLN
INTRAMUSCULAR | Status: AC
  Filled 2019-01-19: qty 2

## 2019-01-19 MED ORDER — ACETAMINOPHEN 500 MG PO TABS
ORAL_TABLET | ORAL | Status: AC
  Filled 2019-01-19: qty 2

## 2019-01-19 MED ORDER — FENTANYL CITRATE (PF) 100 MCG/2ML IJ SOLN: 25.0000 ug | INTRAMUSCULAR | Status: DC | PRN

## 2019-01-19 MED ORDER — SCOPOLAMINE 1 MG/3DAYS TD PT72
MEDICATED_PATCH | TRANSDERMAL | Status: AC
  Filled 2019-01-19: qty 1

## 2019-01-19 MED ORDER — SCOPOLAMINE 1 MG/3DAYS TD PT72: 1.0000 | MEDICATED_PATCH | Freq: Once | TRANSDERMAL | Status: DC | PRN

## 2019-01-19 MED ORDER — PROMETHAZINE HCL 25 MG/ML IJ SOLN: 6.2500 mg | INTRAMUSCULAR | Status: DC | PRN

## 2019-01-19 MED ORDER — BUPIVACAINE HCL (PF) 0.25 % IJ SOLN
INTRAMUSCULAR | Status: DC | PRN
  Administered 2019-01-19: 10 mL

## 2019-01-19 MED ORDER — LIDOCAINE HCL (CARDIAC) PF 100 MG/5ML IV SOSY
PREFILLED_SYRINGE | INTRAVENOUS | Status: DC | PRN
  Administered 2019-01-19: 100 mg via INTRAVENOUS

## 2019-01-19 MED ORDER — DEXAMETHASONE SODIUM PHOSPHATE 4 MG/ML IJ SOLN
INTRAMUSCULAR | Status: DC | PRN
  Administered 2019-01-19: 10 mg via INTRAVENOUS

## 2019-01-19 MED ORDER — MIDAZOLAM HCL 2 MG/2ML IJ SOLN
1.0000 mg | INTRAMUSCULAR | Status: DC | PRN
  Administered 2019-01-19: 1 mg via INTRAVENOUS

## 2019-01-19 MED ORDER — CEFAZOLIN SODIUM-DEXTROSE 2-4 GM/100ML-% IV SOLN
2.0000 g | INTRAVENOUS | Status: AC
  Administered 2019-01-19: 2 g via INTRAVENOUS

## 2019-01-19 MED ORDER — SCOPOLAMINE 1 MG/3DAYS TD PT72: 1.0000 | MEDICATED_PATCH | TRANSDERMAL | Status: DC

## 2019-01-19 MED ORDER — ONDANSETRON HCL 4 MG/2ML IJ SOLN
INTRAMUSCULAR | Status: DC | PRN
  Administered 2019-01-19: 4 mg via INTRAVENOUS

## 2019-01-19 MED ORDER — LIDOCAINE 2% (20 MG/ML) 5 ML SYRINGE
INTRAMUSCULAR | Status: AC
  Filled 2019-01-19: qty 5

## 2019-01-19 MED ORDER — PROPOFOL 10 MG/ML IV BOLUS
INTRAVENOUS | Status: DC | PRN
  Administered 2019-01-19: 200 mg via INTRAVENOUS

## 2019-01-19 MED ORDER — KETOROLAC TROMETHAMINE 15 MG/ML IJ SOLN
15.0000 mg | INTRAMUSCULAR | Status: AC
  Administered 2019-01-19: 15 mg via INTRAVENOUS

## 2019-01-19 SURGICAL SUPPLY — 64 items
ADH SKN CLS APL DERMABOND .7 (GAUZE/BANDAGES/DRESSINGS) ×1
APPLIER CLIP 9.375 MED OPEN (MISCELLANEOUS)
APR CLP MED 9.3 20 MLT OPN (MISCELLANEOUS)
BINDER BREAST LRG (GAUZE/BANDAGES/DRESSINGS) IMPLANT
BINDER BREAST MEDIUM (GAUZE/BANDAGES/DRESSINGS) IMPLANT
BINDER BREAST XLRG (GAUZE/BANDAGES/DRESSINGS) IMPLANT
BINDER BREAST XXLRG (GAUZE/BANDAGES/DRESSINGS) ×1 IMPLANT
BLADE SURG 15 STRL LF DISP TIS (BLADE) ×1 IMPLANT
BLADE SURG 15 STRL SS (BLADE) ×2
CANISTER SUC SOCK COL 7IN (MISCELLANEOUS) IMPLANT
CANISTER SUCT 1200ML W/VALVE (MISCELLANEOUS) IMPLANT
CHLORAPREP W/TINT 26ML (MISCELLANEOUS) ×2 IMPLANT
CLIP APPLIE 9.375 MED OPEN (MISCELLANEOUS) IMPLANT
CLIP VESOCCLUDE SM WIDE 6/CT (CLIP) IMPLANT
COVER BACK TABLE 60X90IN (DRAPES) ×2 IMPLANT
COVER MAYO STAND STRL (DRAPES) ×2 IMPLANT
COVER PROBE W GEL 5X96 (DRAPES) ×2 IMPLANT
COVER WAND RF STERILE (DRAPES) IMPLANT
DECANTER SPIKE VIAL GLASS SM (MISCELLANEOUS) IMPLANT
DERMABOND ADVANCED (GAUZE/BANDAGES/DRESSINGS) ×1
DERMABOND ADVANCED .7 DNX12 (GAUZE/BANDAGES/DRESSINGS) ×1 IMPLANT
DRAPE LAPAROSCOPIC ABDOMINAL (DRAPES) ×2 IMPLANT
DRAPE UTILITY XL STRL (DRAPES) ×2 IMPLANT
DRSG TEGADERM 4X4.75 (GAUZE/BANDAGES/DRESSINGS) IMPLANT
ELECT COATED BLADE 2.86 ST (ELECTRODE) ×2 IMPLANT
ELECT REM PT RETURN 9FT ADLT (ELECTROSURGICAL) ×2
ELECTRODE REM PT RTRN 9FT ADLT (ELECTROSURGICAL) ×1 IMPLANT
GAUZE SPONGE 4X4 12PLY STRL LF (GAUZE/BANDAGES/DRESSINGS) ×1 IMPLANT
GLOVE BIO SURGEON STRL SZ 6.5 (GLOVE) ×1 IMPLANT
GLOVE BIO SURGEON STRL SZ7 (GLOVE) ×5 IMPLANT
GLOVE BIOGEL PI IND STRL 6.5 (GLOVE) IMPLANT
GLOVE BIOGEL PI IND STRL 7.0 (GLOVE) IMPLANT
GLOVE BIOGEL PI IND STRL 7.5 (GLOVE) ×1 IMPLANT
GLOVE BIOGEL PI INDICATOR 6.5 (GLOVE) ×1
GLOVE BIOGEL PI INDICATOR 7.0 (GLOVE) ×1
GLOVE BIOGEL PI INDICATOR 7.5 (GLOVE) ×2
GOWN STRL REUS W/ TWL LRG LVL3 (GOWN DISPOSABLE) ×2 IMPLANT
GOWN STRL REUS W/ TWL XL LVL3 (GOWN DISPOSABLE) IMPLANT
GOWN STRL REUS W/TWL LRG LVL3 (GOWN DISPOSABLE) ×4
GOWN STRL REUS W/TWL XL LVL3 (GOWN DISPOSABLE) ×2
HEMOSTAT ARISTA ABSORB 3G PWDR (HEMOSTASIS) IMPLANT
ILLUMINATOR WAVEGUIDE N/F (MISCELLANEOUS) ×1 IMPLANT
KIT MARKER MARGIN INK (KITS) ×2 IMPLANT
LIGHT WAVEGUIDE WIDE FLAT (MISCELLANEOUS) IMPLANT
NDL HYPO 25X1 1.5 SAFETY (NEEDLE) ×1 IMPLANT
NEEDLE HYPO 25X1 1.5 SAFETY (NEEDLE) ×2 IMPLANT
NS IRRIG 1000ML POUR BTL (IV SOLUTION) ×1 IMPLANT
PACK BASIN DAY SURGERY FS (CUSTOM PROCEDURE TRAY) ×2 IMPLANT
PENCIL BUTTON HOLSTER BLD 10FT (ELECTRODE) ×2 IMPLANT
SLEEVE SCD COMPRESS KNEE MED (MISCELLANEOUS) ×2 IMPLANT
SPONGE LAP 4X18 RFD (DISPOSABLE) ×2 IMPLANT
STRIP CLOSURE SKIN 1/2X4 (GAUZE/BANDAGES/DRESSINGS) ×2 IMPLANT
SUT MNCRL AB 4-0 PS2 18 (SUTURE) IMPLANT
SUT MON AB 5-0 PS2 18 (SUTURE) ×1 IMPLANT
SUT SILK 2 0 SH (SUTURE) IMPLANT
SUT VIC AB 2-0 SH 27 (SUTURE) ×2
SUT VIC AB 2-0 SH 27XBRD (SUTURE) ×1 IMPLANT
SUT VIC AB 3-0 SH 27 (SUTURE) ×2
SUT VIC AB 3-0 SH 27X BRD (SUTURE) ×1 IMPLANT
SYR CONTROL 10ML LL (SYRINGE) ×2 IMPLANT
TOWEL GREEN STERILE FF (TOWEL DISPOSABLE) ×2 IMPLANT
TRAY FAXITRON CT DISP (TRAY / TRAY PROCEDURE) ×2 IMPLANT
TUBE CONNECTING 20X1/4 (TUBING) IMPLANT
YANKAUER SUCT BULB TIP NO VENT (SUCTIONS) IMPLANT

## 2019-01-19 NOTE — Anesthesia Preprocedure Evaluation (Addendum)
Anesthesia Evaluation  Patient identified by MRN, date of birth, ID band Patient awake    Reviewed: Allergy & Precautions, NPO status , Patient's Chart, lab work & pertinent test results  History of Anesthesia Complications Negative for: history of anesthetic complications  Airway Mallampati: I  TM Distance: >3 FB Neck ROM: Full    Dental  (+) Teeth Intact, Dental Advisory Given, Caps,    Pulmonary neg pulmonary ROS,    Pulmonary exam normal        Cardiovascular hypertension, Pt. on home beta blockers and Pt. on medications Normal cardiovascular exam Rhythm:Regular Rate:Normal     Neuro/Psych negative neurological ROS  negative psych ROS   GI/Hepatic negative GI ROS, Neg liver ROS,   Endo/Other  diabetes, Type 2, Oral Hypoglycemic AgentsMorbid obesity  Renal/GU    Endometrial Ca    Musculoskeletal  (+) Arthritis , Osteoarthritis,    Abdominal   Peds  Hematology   Anesthesia Other Findings   Reproductive/Obstetrics                            Anesthesia Physical  Anesthesia Plan  ASA: II  Anesthesia Plan: General   Post-op Pain Management:    Induction: Intravenous  PONV Risk Score and Plan: 3 and Ondansetron, Treatment may vary due to age or medical condition, Dexamethasone and Scopolamine patch - Pre-op  Airway Management Planned: LMA  Additional Equipment:   Intra-op Plan:   Post-operative Plan: Extubation in OR  Informed Consent:   Plan Discussed with:   Anesthesia Plan Comments:         Anesthesia Quick Evaluation

## 2019-01-19 NOTE — Discharge Instructions (Signed)
[place] Office Phone Number [phone]   POST OP INSTRUCTIONS Take 400 mg of ibuprofen every 8 hours or 650 mg tylenol every 6 hours for next 72 hours then as needed. Use ice several times daily also. Always review your discharge instruction sheet given to you by the facility where your surgery was performed.  IF YOU HAVE DISABILITY OR FAMILY LEAVE FORMS, YOU MUST BRING THEM TO THE OFFICE FOR PROCESSING.  DO NOT GIVE THEM TO YOUR DOCTOR.  1. A prescription for pain medication may be given to you upon discharge.  Take your pain medication as prescribed, if needed.  If narcotic pain medicine is not needed, then you may take acetaminophen (Tylenol), naprosyn (Alleve) or ibuprofen (Advil) as needed. NO Tylenol or Ibuprofen until 8:15pm if needed 2. Take your usually prescribed medications unless otherwise directed 3. If you need a refill on your pain medication, please contact your pharmacy.  They will contact our office to request authorization.  Prescriptions will not be filled after 5pm or on week-ends. 4. You should eat very light the first 24 hours after surgery, such as soup, crackers, pudding, etc.  Resume your normal diet the day after surgery. 5. Most patients will experience some swelling and bruising in the breast.  Ice packs and a good support bra will help.  Wear the breast binder provided or a sports bra for 72 hours day and night.  After that wear a sports bra during the day until you return to the office. Swelling and bruising can take several days to resolve.  6. It is common to experience some constipation if taking pain medication after surgery.  Increasing fluid intake and taking a stool softener will usually help or prevent this problem from occurring.  A mild laxative (Milk of Magnesia or Miralax) should be taken according to package directions if there are no bowel movements after 48 hours. 7. Unless discharge instructions indicate otherwise, you may remove  your bandages 48 hours after surgery and you may shower at that time.  You may have steri-strips (small skin tapes) in place directly over the incision.  These strips should be left on the skin for 7-10 days and will come off on their own.  If your surgeon used skin glue on the incision, you may shower in 24 hours.  The glue will flake off over the next 2-3 weeks.  Any sutures or staples will be removed at the office during your follow-up visit. 8. ACTIVITIES:  You may resume regular daily activities (gradually increasing) beginning the next day.  Wearing a good support bra or sports bra minimizes pain and swelling.  You may have sexual intercourse when it is comfortable. a. You may drive when you no longer are taking prescription pain medication, you can comfortably wear a seatbelt, and you can safely maneuver your car and apply brakes. b. RETURN TO WORK:  ______________________________________________________________________________________ 9. You should see your doctor in the office for a follow-up appointment approximately two weeks after your surgery.  Your doctors nurse will typically make your follow-up appointment when she calls you with your pathology report.  Expect your pathology report 3-4 business days after your surgery.  You may call to check if you do not hear from [place] after three days. 10. OTHER INSTRUCTIONS: _______________________________________________________________________________________________ _____________________________________________________________________________________________________________________________________ _____________________________________________________________________________________________________________________________________ _____________________________________________________________________________________________________________________________________  WHEN TO CALL DR WAKEFIELD: 1. Fever over 101.0 2. Nausea and/or vomiting. 3. Extreme  swelling or bruising. 4. Continued bleeding from incision. 5. Increased pain, redness, or drainage from  the incision.  The clinic staff is available to answer your questions during regular business hours.  Please dont hesitate to call and ask to speak to one of the nurses for clinical concerns.  If you have a medical emergency, go to the nearest emergency room or call 911.  A surgeon from [facility] is always on call at the hospital.  For further questions, please visit [email or web address]   Post Anesthesia Home Care Instructions  Activity: Get plenty of rest for the remainder of the day. A responsible individual must stay with you for 24 hours following the procedure.  For the next 24 hours, DO NOT: -Drive a car -Paediatric nurse -Drink alcoholic beverages -Take any medication unless instructed by your physician -Make any legal decisions or sign important papers.  Meals: Start with liquid foods such as gelatin or soup. Progress to regular foods as tolerated. Avoid greasy, spicy, heavy foods. If nausea and/or vomiting occur, drink only clear liquids until the nausea and/or vomiting subsides. Call your physician if vomiting continues.  Special Instructions/Symptoms: Your throat may feel dry or sore from the anesthesia or the breathing tube placed in your throat during surgery. If this causes discomfort, gargle with warm salt water. The discomfort should disappear within 24 hours.  If you had a scopolamine patch placed behind your ear for the management of post- operative nausea and/or vomiting:  1. The medication in the patch is effective for 72 hours, after which it should be removed.  Wrap patch in a tissue and discard in the trash. Wash hands thoroughly with soap and water. 2. You may remove the patch earlier than 72 hours if you experience unpleasant side effects which may include dry mouth, dizziness or visual disturbances. 3. Avoid touching the patch.  Wash your hands with soap and water after contact with the patch.

## 2019-01-19 NOTE — Interval H&P Note (Signed)
History and Physical Interval Note:  01/19/2019 2:50 PM  Katherine Frederick  has presented today for surgery, with the diagnosis of LEFT BREAST MASS  The various methods of treatment have been discussed with the patient and family. After consideration of risks, benefits and other options for treatment, the patient has consented to  Procedure(s): RADIOACTIVE SEED GUIDED EXCISIONAL LEFT BREAST BIOPSY (Left) as a surgical intervention .  The patient's history has been reviewed, patient examined, no change in status, stable for surgery.  I have reviewed the patient's chart and labs.  Questions were answered to the patient's satisfaction.     Rolm Bookbinder

## 2019-01-19 NOTE — H&P (Signed)
68 yof referred by Dr Marisue Humble for left breast mammo abnormality. she had no mass or dc. no fh and no personal history breast disease. she has b density breasts. screening mm shows a left breast distortion and normal right breast. no [place] correlate. she underwent core biopsy and this is csl. she was referred for options   Past Surgical History Nance Pew, [place]; 01/13/2019 1:30 PM) Breast Biopsy  Left. Cesarean Section - Multiple  Colon Polyp Removal - Colonoscopy  Gallbladder Surgery - Laparoscopic  Hysterectomy (due to cancer) - Complete   Diagnostic Studies History Nance Pew, CMA; 01/13/2019 1:30 PM) Colonoscopy  5-10 years ago Mammogram  within last year Pap Smear  1-5 years ago  Allergies Nance Pew, CMA; 01/13/2019 1:32 PM) Macrobid *URINARY ANTI-INFECTIVES*  Dermatitis. Lisinopril *CHEMICALS*  Dermatitis. Allergies Reconciled   Medication History Nance Pew, CMA; 01/13/2019 1:32 PM) hydroCHLOROthiazide (25MG  Tablet, Oral) Active. Losartan Potassium (100MG  Tablet, Oral) Active. Metoprolol Tartrate (50MG  Tablet, Oral) Active. Ibuprofen (600MG  Tablet, Oral) Active. Simvastatin (20MG  Tablet, Oral) Active. Vitamin D (Ergocalciferol) (50000UNIT Capsule, Oral) Active. Medications Reconciled  Social History Nance Pew, CMA; 01/13/2019 1:30 PM) Caffeine use  Carbonated beverages, Tea. No alcohol use  No drug use  Tobacco use  Never smoker.  Family History Nance Pew, [place]; 01/13/2019 1:30 PM) Arthritis  Mother. Cancer  Brother. Cerebrovascular Accident  Father. Colon Polyps  Mother. Heart Disease  Father. Hypertension  Brother, Father, Mother. Kidney Disease  Father. Melanoma  Daughter.  Pregnancy / Birth History Nance Pew, [place]; 01/13/2019 1:30 PM) Age at menarche  68 years. Age of menopause  61-50 Contraceptive History  Oral contraceptives. Gravida  2 Length (months) of breastfeeding  3-6 Maternal age  48-25 Para   2  Other Problems Nance Pew, CMA; 01/13/2019 1:30 PM) Arthritis  Cancer  Cholelithiasis  Diabetes Mellitus  Hemorrhoids  High blood pressure  Hypercholesterolemia  Lump In Breast     Review of Systems ([place]; 01/13/2019 1:30 PM) General Present- Night Sweats. Not Present- Appetite Loss, Chills, Fatigue, Fever, Weight Gain and Weight Loss. Skin Present- Rash. Not Present- Change in Wart/Mole, Dryness, Hives, Jaundice, New Lesions, Non-Healing Wounds and Ulcer. HEENT Present- Wears glasses/contact lenses. Not Present- Earache, Hearing Loss, Hoarseness, Nose Bleed, Oral Ulcers, Ringing in the Ears, Seasonal Allergies, Sinus Pain, Sore Throat, Visual Disturbances and Yellow Eyes. Respiratory Not Present- Bloody sputum, Chronic Cough, Difficulty Breathing, Snoring and Wheezing. Breast Present- Breast Mass. Not Present- Breast Pain, Nipple Discharge and Skin Changes. Cardiovascular Not Present- Chest Pain, Difficulty Breathing Lying Down, Leg Cramps, Palpitations, Rapid Heart Rate, Shortness of Breath and Swelling of Extremities. Gastrointestinal Not Present- Abdominal Pain, Bloating, Bloody Stool, Change in Bowel Habits, Chronic diarrhea, Constipation, Difficulty Swallowing, Excessive gas, Gets full quickly at meals, Hemorrhoids, Indigestion, Nausea, Rectal Pain and Vomiting. Female Genitourinary Present- Urgency. Not Present- Frequency, Nocturia, Painful Urination and Pelvic Pain. Musculoskeletal Present- Muscle Pain. Not Present- Back Pain, Joint Pain, Joint Stiffness, Muscle Weakness and Swelling of Extremities. Neurological Not Present- Decreased Memory, Fainting, Headaches, Numbness, Seizures, Tingling, Tremor, Trouble walking and Weakness. Psychiatric Not Present- Anxiety, Bipolar, Change in Sleep Pattern, Depression, Fearful and Frequent crying. Endocrine Not Present- Cold Intolerance, Excessive Hunger, Hair Changes, Heat Intolerance, Hot flashes and New  Diabetes. Hematology Not Present- Blood Thinners, Easy Bruising, Excessive bleeding, Gland problems, HIV and Persistent Infections.  Vitals (Sabrina Canty CMA; 01/13/2019 1:33 PM) 01/13/2019 1:32 PM Weight: 216 lb Height: 64in Body Surface Area: 2.02 m Body Mass Index: 37.08 kg/m  Temp.:  97.55F(Temporal)  Pulse: 93 (Regular)  P.OX: 99% (Room air) BP: 130/84 (Sitting, Left Arm, Standard) Physical Exam Rolm Bookbinder MD; 01/13/2019 2:59 PM) General Mental Status-Alert. Orientation-Oriented X3. Eye Sclera/Conjunctiva - Bilateral-No scleral icterus. Chest and Lung Exam Chest and lung exam reveals -quiet, even and easy respiratory effort with no use of accessory muscles and on auscultation, normal breath sounds, no adventitious sounds and normal vocal resonance. Breast Nipples-No Discharge. Breast Lump-No Palpable Breast Mass. Cardiovascular Cardiovascular examination reveals -normal heart sounds, regular rate and rhythm with no murmurs. Lymphatic Head & Neck General Head & Neck Lymphatics: Bilateral - Description - Normal. Axillary General Axillary Region: Bilateral - Description - Normal.   Assessment & Plan Rolm Bookbinder MD; 01/13/2019 3:01 PM) RADIAL SCAR OF LEFT BREAST (L90.5) Story: Left breast seed guided excisional biopsy discussed options. recommended excision with local rate of upgrade to cancer or atypia prompting mgt change of 16%. discussed seed placement, surgery, recovery and risks. will proceed soon

## 2019-01-19 NOTE — Transfer of Care (Signed)
Immediate Anesthesia Transfer of Care Note  Patient: Katherine Frederick  Procedure(s) Performed: RADIOACTIVE SEED GUIDED EXCISIONAL LEFT BREAST BIOPSY (Left Breast)  Patient Location: PACU  Anesthesia Type:General  Level of Consciousness: awake, sedated and patient cooperative  Airway & Oxygen Therapy: Patient Spontanous Breathing and Patient connected to face mask oxygen  Post-op Assessment: Report given to RN and Post -op Vital signs reviewed and stable  Post vital signs: Reviewed and stable  Last Vitals:  Vitals Value Taken Time  BP    Temp    Pulse    Resp    SpO2      Last Pain:  Vitals:   01/19/19 1355  TempSrc: Oral  PainSc: 0-No pain         Complications: No apparent anesthesia complications

## 2019-01-19 NOTE — Anesthesia Postprocedure Evaluation (Signed)
Anesthesia Post Note  Patient: Katherine Frederick  Procedure(s) Performed: RADIOACTIVE SEED GUIDED EXCISIONAL LEFT BREAST BIOPSY (Left Breast)     Patient location during evaluation: PACU Anesthesia Type: General Level of consciousness: awake and alert Pain management: pain level controlled Vital Signs Assessment: post-procedure vital signs reviewed and stable Respiratory status: spontaneous breathing, nonlabored ventilation and respiratory function stable Cardiovascular status: blood pressure returned to baseline and stable Postop Assessment: no apparent nausea or vomiting Anesthetic complications: no    Last Vitals:  Vitals:   01/19/19 1615 01/19/19 1630  BP: (!) 111/46 (!) 107/55  Pulse: 65 61  Resp: (!) 9 14  Temp: 36.6 C   SpO2: 95% 98%    Last Pain:  Vitals:   01/19/19 1630  TempSrc:   PainSc: 0-No pain                 Niharika Savino,W. EDMOND

## 2019-01-19 NOTE — Op Note (Signed)
Preoperative diagnosis: Left breast mammographic abnormality with core biopsy showing a radial scar Postoperative diagnosis: Same as above Procedure: Left breast radioactive seed guided excisional biopsy Surgeon: Dr. Serita Grammes Anesthesia: General Estimated blood loss: Minimal Specimens: Left breast tissue marked with paint containing radioactive seed and clip Complications: None Drains: None Special count was correct at completion Disposition to recovery stable condition  Indications: This is a 68 year old female underwent a screening mammogram with the finding of a left breast distortion.  This underwent a biopsy and was a complex sclerosing lesion.  We discussed all of her options and she elected to have this excised.  This was also my recommendation.  She had a radioactive seed placed at this location.  I had these mammograms available on the day of surgery.  Procedure: After informed consent was obtained the patient first was given antibiotics.  SCDs were in place.  She was then placed under general anesthesia without complication.  Her left breast was prepped and draped in the standard sterile surgical fashion.  A surgical timeout was then performed.  Identified the radioactive seed in the left upper inner quadrant.  This was about 5 cm from the nipple areolar complex.  I infiltrated Marcaine throughout this area.  I then made a periareolar incision on order to hide the scar later.  I then tunneled to the radioactive seed using the lighted retractor.  I then remove the seed and the surrounding tissue.  This was then marked with paint.  I then obtained a mammogram which confirmed removal of the seed and the clip.  Hemostasis was then obtained.  I then closed the breast tissue with 2-0 Vicryl.  The skin was closed with 3-0 Vicryl and 5-0 Monocryl.  Glue and Steri-Strips were applied.  She tolerated this well was extubated and transferred to recovery stable.

## 2019-01-19 NOTE — Anesthesia Procedure Notes (Signed)
Procedure Name: LMA Insertion Date/Time: 01/19/2019 3:20 PM Performed by: Lyndee Leo, CRNA Pre-anesthesia Checklist: Patient identified, Emergency Drugs available, Suction available and Patient being monitored Patient Re-evaluated:Patient Re-evaluated prior to induction Oxygen Delivery Method: Circle system utilized Preoxygenation: Pre-oxygenation with 100% oxygen Induction Type: IV induction Ventilation: Mask ventilation without difficulty LMA: LMA inserted LMA Size: 4.0 Number of attempts: 1 Airway Equipment and Method: Bite block Placement Confirmation: positive ETCO2 Tube secured with: Tape Dental Injury: Teeth and Oropharynx as per pre-operative assessment

## 2019-01-20 ENCOUNTER — Encounter (HOSPITAL_BASED_OUTPATIENT_CLINIC_OR_DEPARTMENT_OTHER): Payer: Self-pay | Admitting: General Surgery

## 2019-02-03 ENCOUNTER — Encounter: Payer: Self-pay | Admitting: Gynecologic Oncology

## 2019-02-03 ENCOUNTER — Inpatient Hospital Stay: Payer: Medicare Other | Attending: Gynecologic Oncology | Admitting: Gynecologic Oncology

## 2019-02-03 VITALS — BP 157/75 | HR 58 | Temp 98.1°F | Resp 16 | Ht 64.0 in | Wt 218.0 lb

## 2019-02-03 DIAGNOSIS — Z7982 Long term (current) use of aspirin: Secondary | ICD-10-CM | POA: Insufficient documentation

## 2019-02-03 DIAGNOSIS — E7800 Pure hypercholesterolemia, unspecified: Secondary | ICD-10-CM | POA: Diagnosis not present

## 2019-02-03 DIAGNOSIS — Z90710 Acquired absence of both cervix and uterus: Secondary | ICD-10-CM

## 2019-02-03 DIAGNOSIS — Z90722 Acquired absence of ovaries, bilateral: Secondary | ICD-10-CM | POA: Diagnosis not present

## 2019-02-03 DIAGNOSIS — I10 Essential (primary) hypertension: Secondary | ICD-10-CM | POA: Insufficient documentation

## 2019-02-03 DIAGNOSIS — L409 Psoriasis, unspecified: Secondary | ICD-10-CM | POA: Diagnosis not present

## 2019-02-03 DIAGNOSIS — E559 Vitamin D deficiency, unspecified: Secondary | ICD-10-CM | POA: Insufficient documentation

## 2019-02-03 DIAGNOSIS — C541 Malignant neoplasm of endometrium: Secondary | ICD-10-CM | POA: Insufficient documentation

## 2019-02-03 DIAGNOSIS — Z79899 Other long term (current) drug therapy: Secondary | ICD-10-CM | POA: Diagnosis not present

## 2019-02-03 DIAGNOSIS — R7303 Prediabetes: Secondary | ICD-10-CM | POA: Insufficient documentation

## 2019-02-03 DIAGNOSIS — E785 Hyperlipidemia, unspecified: Secondary | ICD-10-CM | POA: Diagnosis not present

## 2019-02-03 NOTE — Patient Instructions (Signed)
Please notify Dr Taydem Cavagnaro at phone number [phone] if you notice vaginal bleeding, new pelvic or abdominal pains, bloating, feeling full easy, or a change in bladder or bowel function.   Please contact Dr Delorise Hunkele's office (at [phone]) in October, 2020 to request an appointment with her for February, 2021.  

## 2019-02-03 NOTE — Progress Notes (Signed)
Follow-up Note: Gyn-Onc  Consult was requested by Dr. Debroah Baller, NP for the evaluation of Katherine Frederick 69 y.o. female  CC:  Chief Complaint  Patient presents with  . Endometrial cancer [facility])    Assessment/Plan:  [name]  is a 68 y.o.  year old with a history of stage IA grade 1 endometrial cancer, MMR normal.  Pathology revealed low risk factors for recurrence, therefore no adjuvant therapy is recommended according to NCCN guidelines.  I discussed risk for recurrence and typical symptoms encouraged her to notify us of these should they develop between visits.  I recommend she have follow-up every 6 months for 5 years in accordance with NCCN guidelines. Those visits should include symptom assessment, physical exam and pelvic examination. Pap smears are not indicated or recommended in the routine surveillance of endometrial cancer.  She will follow-up with me in 6 months and Auma Thongteum in 12 months   HPI: [name] is a very pleasant 68 year old P2 who is seen in consultation at the request of Dr Nelda Marseille and Delice Bison NP for grade 1 endometrioid endometrial cancer.  The patient has a history of postmenopausal bleeding since approximately 2011.  She has had intermittent biopsies and D&Cs which had always returned as benign polyps.  She had a new episode of bright red vaginal bleeding that was somewhat different in characteristic in June 2019.  This prompted her to be seen by Delice Bison who performed a transvaginal ultrasound scan on June 18, 2018 which revealed a uterus measuring 4.3 x 7.5 x 4.8 cm with an endometrial thickness of 1.6 cm.  The ovaries were grossly normal bilaterally.  An endometrial Pipelle biopsy was then performed on the same day which revealed endometrioid adenocarcinoma FIGO grade 1 arising the background of complex atypical hyperplasia.  Patient otherwise carries a medical history of diet-controlled prediabetes with  most recent HbA1c of 6%.  She has a history of hypertension hypercholesterolemia and vitamin D deficiency.  She has a history of psoriasis.  She has had 2 prior cesarean sections and laparoscopic cholecystectomy laparoscopic tubal ligation but no other abdominal surgeries.  She takes 81 mg of aspirin daily for general cardiovascular health.  Her family history is not remarkable for colorectal, uterine, ovary, or breast cancer.  She also reports symptoms of a lesion that has been followed and somewhat stable for approximately 6 years on the vulva which is somewhat whitish in appearance she takes clobetasol for this.  It is never been biopsied.  On 07/15/18 she underwent robotic assisted total hysterectomy, BSO, SLN biopsy.  Surgery was uncomplicated.  Final pathology revealed a 2.5cm grade 1 endometrioid tumor with no myometrial invasion. There were negative bilateral SLN's. Perineum biopsy was negative.  She was determined to have low risk features and therefore no adjuvant therapy was recommended in accordance with NCCN guidelines.   Interval Hx:  She was seen for a routine mammogram in December, 2019 and an abnormality was noted. She underwent surgery for this which showed no malignancy, but she did have atypical cells.   Current Meds:  Outpatient Encounter Medications as of 02/03/2019  Medication Sig  . acetaminophen (TYLENOL) 500 MG tablet Take 1,000 mg by mouth every 6 (six) hours as needed (for pain.).  Marland Kitchen aspirin EC 81 MG tablet Take 81 mg by mouth at bedtime.  . Calcium Carb-Cholecalciferol (CALCIUM 600+D3 PO) Take 1 tablet by mouth daily.  Marland Kitchen losartan (COZAAR) 100 MG tablet Take 100 mg  by mouth daily.  . metoprolol (LOPRESSOR) 50 MG tablet Take 50 mg by mouth 2 (two) times daily.  . Multiple Vitamin (MULITIVITAMIN WITH MINERALS) TABS Take 1 tablet by mouth at bedtime.   . simvastatin (ZOCOR) 20 MG tablet Take 20 mg by mouth every evening.  . Vitamin D, Ergocalciferol, (DRISDOL) 50000  UNITS CAPS Take 50,000 Units by mouth every Monday.   . fluocinonide (LIDEX) 0.05 % external solution Apply 1 application topically daily as needed (for itching/irritation.).   Marland Kitchen hydrochlorothiazide (HYDRODIURIL) 25 MG tablet Take 12.5 mg by mouth daily.    No facility-administered encounter medications on file as of 02/03/2019.     Allergy:  Allergies  Allergen Reactions  . Lisinopril Cough  . Macrodantin Nausea Only and Other (See Comments)    Abdominal pains    Social Hx:   Social History   Socioeconomic History  . Marital status: Widowed    Spouse name: Not on file  . Number of children: Not on file  . Years of education: Not on file  . Highest education level: Not on file  Occupational History  . Not on file  Social Needs  . Financial resource strain: Not on file  . Food insecurity:    Worry: Not on file    Inability: Not on file  . Transportation needs:    Medical: Not on file    Non-medical: Not on file  Tobacco Use  . Smoking status: Never Smoker  . Smokeless tobacco: Never Used  Substance and Sexual Activity  . Alcohol use: No  . Drug use: No  . Sexual activity: Not Currently    Birth control/protection: Surgical  Lifestyle  . Physical activity:    Days per week: Not on file    Minutes per session: Not on file  . Stress: Not on file  Relationships  . Social connections:    Talks on phone: Not on file    Gets together: Not on file    Attends religious service: Not on file    Active member of club or organization: Not on file    Attends meetings of clubs or organizations: Not on file    Relationship status: Not on file  . Intimate partner violence:    Fear of current or ex partner: Not on file    Emotionally abused: Not on file    Physically abused: Not on file    Forced sexual activity: Not on file  Other Topics Concern  . Not on file  Social History Narrative  . Not on file    Past Surgical Hx:  Past Surgical History:  Procedure Laterality  Date  . ABDOMINAL HYSTERECTOMY     laparoscopic Dr. Denman George 07-15-18  . CESAREAN SECTION     x 2  . CHOLECYSTECTOMY    . COLONOSCOPY  2016   x 3  . DILATATION & CURETTAGE/HYSTEROSCOPY WITH MYOSURE N/A 02/28/2016   Procedure: DILATATION & CURETTAGE/HYSTEROSCOPY WITH MYOSURE;  Surgeon: Janyth Pupa, DO;  Location: [facility];  Service: Gynecology;  Laterality: N/A;  Polypectomy  . HYSTEROSCOPY W/D&C  02/27/2012   Procedure: DILATATION AND CURETTAGE /HYSTEROSCOPY;  Surgeon: Olga Millers, MD;  Location: [facility];  Service: Gynecology;  Laterality: N/A;  . RADIOACTIVE SEED GUIDED EXCISIONAL BREAST BIOPSY Left 01/19/2019   Procedure: RADIOACTIVE SEED GUIDED EXCISIONAL LEFT BREAST BIOPSY;  Surgeon: Rolm Bookbinder, MD;  Location: [facility];  Service: General;  Laterality: Left;  . ROBOTIC ASSISTED TOTAL HYSTERECTOMY WITH BILATERAL SALPINGO OOPHERECTOMY  Bilateral 07/15/2018   Procedure: XI ROBOTIC ASSISTED TOTAL HYSTERECTOMY WITH BILATERAL SALPINGO OOPHORECTOMY WITH SENTINEL LYMPH NODE BIOPSY AND VULVAR BIOPSY;  Surgeon: Everitt Amber, MD;  Location: [facility];  Service: Gynecology;  Laterality: Bilateral;  . TUBAL LIGATION    . VULVA Milagros Loll BIOPSY N/A 07/15/2018   Procedure: VULVAR BIOPSY;  Surgeon: Everitt Amber, MD;  Location: [facility];  Service: Gynecology;  Laterality: N/A;  . WISDOM TOOTH EXTRACTION      Past Medical Hx:  Past Medical History:  Diagnosis Date  . Arthritis    knees  . Cancer [facility])    endometrial cancer   . Hypercholesteremia   . Hyperlipidemia   . Hypertension   . Left breast mass   . Post-menopausal bleeding   . Pre-diabetes    no meds  . Psoriasis     Past Gynecological History:  C/s x 2, no history of abn paps (last pap in July, 2019 with normal cytology and negative high risk HPV).  Patient's last menstrual period was 02/24/2012.  Family Hx:  Family History  Problem Relation Age of Onset  . Stomach cancer Mother   . Stroke Father   . Heart attack Father    . Congestive Heart Failure Father   . Alzheimer's disease Sister   . Esophageal cancer Brother   . Hypertension Brother   . Hypertension Brother   . Hypertension Brother   . Anesthesia problems Neg Hx   . Hypotension Neg Hx   . Malignant hyperthermia Neg Hx   . Pseudochol deficiency Neg Hx   . Breast cancer Neg Hx     Review of Systems:  Constitutional  Feels well,    ENT Normal appearing ears and nares bilaterally Skin/Breast  No rash, sores, jaundice, itching, dryness Cardiovascular  No chest pain, shortness of breath, or edema  Pulmonary  No cough or wheeze.  Gastro Intestinal  No nausea, vomitting, or diarrhoea. No bright red blood per rectum, no abdominal pain, change in bowel movement, or constipation.  Genito Urinary  No frequency, urgency, dysuria, no bleeding Musculo Skeletal  No myalgia, arthralgia, joint swelling or pain  Neurologic  No weakness, numbness, change in gait,  Psychology  No depression, anxiety, insomnia.   Vitals:  Blood pressure (!) 157/75, pulse (!) 58, temperature 98.1 F (36.7 C), temperature source Oral, resp. rate 16, height '5\' 4"'$  (1.626 m), weight 218 lb (98.9 kg), last menstrual period 02/24/2012, SpO2 100 %.  Physical Exam: WD in NAD Neck  Supple NROM, without any enlargements.  Lymph Node Survey No cervical supraclavicular or inguinal adenopathy Cardiovascular  Pulse normal rate, regularity and rhythm. S1 and S2 normal.  Lungs  Clear to auscultation bilateraly, without wheezes/crackles/rhonchi. Good air movement.  Skin  No rash/lesions/breakdown  Psychiatry  Alert and oriented to person, place, and time  Abdomen  Normoactive bowel sounds, abdomen soft, non-tender and obese without evidence of hernia. Incisions soft. Back No CVA tenderness Genito Urinary  Vaginal cuff smooth, no masses, no lesions, no bleeding. Perineum in tact and healed. Rectal  deferred  Extremities  No bilateral cyanosis, clubbing or  edema.  Thereasa Solo, MD  02/03/2019, 2:00 PM

## 2019-03-01 ENCOUNTER — Telehealth: Payer: Self-pay | Admitting: Oncology

## 2019-03-01 NOTE — Telephone Encounter (Signed)
A new patient appt has been scheduled for the pt to see Dr. Jana Hakim on 4/2 at 430pm. Pt aware to arrive 15 minutes early.

## 2019-03-11 ENCOUNTER — Inpatient Hospital Stay: Payer: Medicare Other | Attending: Gynecologic Oncology | Admitting: Oncology

## 2019-03-11 ENCOUNTER — Other Ambulatory Visit: Payer: Self-pay

## 2019-03-11 VITALS — BP 134/68 | HR 59 | Temp 98.4°F | Resp 18 | Ht 64.0 in | Wt 213.0 lb

## 2019-03-11 DIAGNOSIS — Z90710 Acquired absence of both cervix and uterus: Secondary | ICD-10-CM | POA: Insufficient documentation

## 2019-03-11 DIAGNOSIS — Z800 Family history of malignant neoplasm of digestive organs: Secondary | ICD-10-CM | POA: Insufficient documentation

## 2019-03-11 DIAGNOSIS — Z8542 Personal history of malignant neoplasm of other parts of uterus: Secondary | ICD-10-CM | POA: Diagnosis not present

## 2019-03-11 DIAGNOSIS — N6092 Unspecified benign mammary dysplasia of left breast: Secondary | ICD-10-CM | POA: Insufficient documentation

## 2019-03-11 DIAGNOSIS — Z90722 Acquired absence of ovaries, bilateral: Secondary | ICD-10-CM | POA: Insufficient documentation

## 2019-03-11 DIAGNOSIS — Z1239 Encounter for other screening for malignant neoplasm of breast: Secondary | ICD-10-CM | POA: Insufficient documentation

## 2019-03-11 DIAGNOSIS — Z7982 Long term (current) use of aspirin: Secondary | ICD-10-CM | POA: Insufficient documentation

## 2019-03-11 DIAGNOSIS — I10 Essential (primary) hypertension: Secondary | ICD-10-CM | POA: Insufficient documentation

## 2019-03-11 DIAGNOSIS — C541 Malignant neoplasm of endometrium: Secondary | ICD-10-CM

## 2019-03-11 DIAGNOSIS — Z9079 Acquired absence of other genital organ(s): Secondary | ICD-10-CM | POA: Insufficient documentation

## 2019-03-11 DIAGNOSIS — Z79899 Other long term (current) drug therapy: Secondary | ICD-10-CM | POA: Diagnosis not present

## 2019-03-11 DIAGNOSIS — N181 Chronic kidney disease, stage 1: Secondary | ICD-10-CM

## 2019-03-11 DIAGNOSIS — E1122 Type 2 diabetes mellitus with diabetic chronic kidney disease: Secondary | ICD-10-CM

## 2019-03-11 NOTE — Progress Notes (Signed)
[place]  [phone] [phone]     ID: Katherine Frederick DOB: 1951/03/01  MR#: [id number]  BPZ#:[id number]  Patient Care Team: Gaynelle Arabian, MD as PCP - General (Family Medicine) Taytum Scheck, Virgie Dad, MD as Consulting Physician (Oncology) Everitt Amber, MD as Consulting Physician (Gynecologic Oncology) Janyth Pupa, DO as Consulting Physician (Obstetrics and Gynecology) Rolm Bookbinder, MD as Consulting Physician (General Surgery) Thongteum, Maryjo Rochester, NP as Nurse Practitioner (Nurse Practitioner) Chauncey Cruel, MD OTHER MD:  CHIEF COMPLAINT: Atypical ductal hyperplasia  CURRENT TREATMENT: Observation   HISTORY OF CURRENT ILLNESS: Katherine Frederick has a history of endometrial cancer, presenting with bright red vaginal bleeding in June 2019.  She underwent transvaginal ultrasonography June 18, 2018 showing an endometrial thickness of 1.6 cm, with grossly normal ovaries.  Biopsy on the same day showed endometrioid adenocarcinoma grade 1 in a background of complex atypical hyperplasia.  On 07/15/2018 she underwent robotic assisted total hysterectomy with bilateral salpingo-oophorectomy and sentinel lymph node biopsy.  Final pathology showed a 2.5 cm grade 1 endometrioid tumor with no myometrial invasion, negative bilateral sentinel lymph nodes, negative peritoneum biopsy.  MMR was negative.  She had low risk features and no adjuvant therapy was indicated.  More recently, on routine mammography December 2019 an abnormality was noted, with left diagnostic mammography with tomography and ultrasonography 12/03/2018 showing breast density category B.  There was a persistent area of subtle distortion of the upper inner left breast with no sonographic correlate.  The left axilla was sonographically benign.  Biopsy of the left breast area of concern 12/11/2018 showed (SAA 20-45) a complex sclerosing lesion.  She was then referred to surgery and on 01/19/2019 Dr.  Donne Hazel performed a left lumpectomy showing (SZA 20-8.3) atypical ductal hyperplasia.  There was no malignancy identified.  The patient's subsequent history is as detailed below.  INTERVAL HISTORY: Katherine Frederick was evaluated in the high-risk clinic 03/11/2019   REVIEW OF SYSTEMS: She tolerated both her surgeries remarkably well and particularly after the breast surgery all she took was a Tylenol.  There was no fever, bleeding, swelling, or erythema.  She is very pleased with the cosmetic result.  She exercises generally at the gym at least 3 days a week, when the genitals are open; currently she walks about 40 minutes most days.  Note that her daughter was found to be COVID-19 positive on oh 3/18.  Patient last saw her daughter the last week in February.  The patient's daughter is now back to work.  Katherine Frederick has had no loss, shortness of breath, fever, or bowel changes.  A detailed review of systems was otherwise noncontributory  PAST MEDICAL HISTORY: Past Medical History:  Diagnosis Date  . Arthritis    knees  . Cancer [facility])    endometrial cancer   . Hypercholesteremia   . Hyperlipidemia   . Hypertension   . Left breast mass   . Post-menopausal bleeding   . Pre-diabetes    no meds  . Psoriasis     PAST SURGICAL HISTORY: Past Surgical History:  Procedure Laterality Date  . ABDOMINAL HYSTERECTOMY     laparoscopic Dr. Denman George 07-15-18  . CESAREAN SECTION     x 2  . CHOLECYSTECTOMY    . COLONOSCOPY  2016   x 3  . DILATATION & CURETTAGE/HYSTEROSCOPY WITH MYOSURE N/A 02/28/2016   Procedure: DILATATION & CURETTAGE/HYSTEROSCOPY WITH MYOSURE;  Surgeon: Janyth Pupa, DO;  Location: [facility];  Service: Gynecology;  Laterality: N/A;  Polypectomy  . HYSTEROSCOPY  W/D&C  02/27/2012   Procedure: DILATATION AND CURETTAGE /HYSTEROSCOPY;  Surgeon: Olga Millers, MD;  Location: [facility];  Service: Gynecology;  Laterality: N/A;  . RADIOACTIVE SEED GUIDED EXCISIONAL BREAST BIOPSY Left 01/19/2019   Procedure:  RADIOACTIVE SEED GUIDED EXCISIONAL LEFT BREAST BIOPSY;  Surgeon: Rolm Bookbinder, MD;  Location: [facility];  Service: General;  Laterality: Left;  . ROBOTIC ASSISTED TOTAL HYSTERECTOMY WITH BILATERAL SALPINGO OOPHERECTOMY Bilateral 07/15/2018   Procedure: XI ROBOTIC ASSISTED TOTAL HYSTERECTOMY WITH BILATERAL SALPINGO OOPHORECTOMY WITH SENTINEL LYMPH NODE BIOPSY AND VULVAR BIOPSY;  Surgeon: Everitt Amber, MD;  Location: [facility];  Service: Gynecology;  Laterality: Bilateral;  . TUBAL LIGATION    . VULVA Milagros Loll BIOPSY N/A 07/15/2018   Procedure: VULVAR BIOPSY;  Surgeon: Everitt Amber, MD;  Location: [facility];  Service: Gynecology;  Laterality: N/A;  . WISDOM TOOTH EXTRACTION      FAMILY HISTORY Family History  Problem Relation Age of Onset  . Stomach cancer Mother   . Stroke Father   . Heart attack Father   . Congestive Heart Failure Father   . Alzheimer's disease Sister   . Esophageal cancer Brother   . Hypertension Brother   . Hypertension Brother   . Hypertension Brother   . Anesthesia problems Neg Hx   . Hypotension Neg Hx   . Malignant hyperthermia Neg Hx   . Pseudochol deficiency Neg Hx   . Breast cancer Neg Hx   The patient's father died at the age of 15 from atherosclerotic cardiovascular disease.  The patient's mother died just before her 28st birthday from a bleeding secondary to stomach cancer.  The patient had 4 brothers, 1 of whom had esophageal cancer, and 1 sister.  There is no breast ovarian pancreatic or prostate cancer in the family to the patient's knowledge  GYNECOLOGIC HISTORY:  Patient's last menstrual period was 02/24/2012. Menarche: 68 years old Age at first live birth: 68 years old [place] P2 Contraceptive: Oral contraceptives for many years with no complications HRT no Hysterectomy? yes Salpingo-oophorectomy? yes    SOCIAL HISTORY:  Katherine Frederick works for [employer] more than 40 years.  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   She finally retired February 14, 2018.  Her husband died in 40 from [place].  The patient lives by herself, with no pets.  Her son Katherine Frederick lives in [place] and works mostly in [place] as an Art gallery manager.  Daughter Katherine Frederick lives in [place] and is a Designer, jewellery working in a minute clinic at [employer] in [place].  The patient has 4 grandchildren.  She is a Tourist information centre manager     ADVANCED DIRECTIVES: Patient's daughter Katherine Frederick is her healthcare power of attorney.  She can be reached at [phone].   HEALTH MAINTENANCE: Social History   Tobacco Use  . Smoking status: Never Smoker  . Smokeless tobacco: Never Used  Substance Use Topics  . Alcohol use: No  . Drug use: No     Colonoscopy: Buccini  PAP: Status post hysterectomy  Bone density: Remote   Allergies  Allergen Reactions  . Lisinopril Cough  . Macrodantin Nausea Only and Other (See Comments)    Abdominal pains    Current Outpatient Medications  Medication Sig Dispense Refill  . acetaminophen (TYLENOL) 500 MG tablet Take 1,000 mg by mouth every 6 (six) hours as needed (for pain.).    Marland Kitchen aspirin EC 81 MG tablet Take 81 mg by mouth at bedtime.    . Calcium  Carb-Cholecalciferol (CALCIUM 600+D3 PO) Take 1 tablet by mouth daily.    . fluocinonide (LIDEX) 0.05 % external solution Apply 1 application topically daily as needed (for itching/irritation.).   3  . hydrochlorothiazide (HYDRODIURIL) 25 MG tablet Take 12.5 mg by mouth daily.     Marland Kitchen losartan (COZAAR) 100 MG tablet Take 100 mg by mouth daily.    . metoprolol (LOPRESSOR) 50 MG tablet Take 50 mg by mouth 2 (two) times daily.    . Multiple Vitamin (MULITIVITAMIN WITH MINERALS) TABS Take 1 tablet by mouth at bedtime.     . simvastatin (ZOCOR) 20 MG tablet Take 20 mg by mouth every evening.    . Vitamin D, Ergocalciferol, (DRISDOL) 50000 UNITS CAPS Take 50,000 Units by mouth every Monday.      No current facility-administered medications for this visit.     OBJECTIVE:  Middle-aged white woman who appears well  Vitals:   03/11/19 1632  BP: 134/68  Pulse: (!) 59  Resp: 18  Temp: 98.4 F (36.9 C)  SpO2: 100%     Body mass index is 36.56 kg/m.   Wt Readings from Last 3 Encounters:  03/11/19 213 lb (96.6 kg)  02/03/19 218 lb (98.9 kg)  01/19/19 218 lb 4.1 oz (99 kg)      ECOG FS:0 - Asymptomatic  Ocular: Sclerae unicteric, pupils round and equal Lymphatic: No cervical or supraclavicular adenopathy Lungs no rales or rhonchi Heart regular rate and rhythm Abd soft, nontender, positive bowel sounds MSK no focal spinal tenderness, no joint edema Neuro: non-focal, well-oriented, appropriate affect Breasts: The right breast is unremarkable.  The left breast is status post recent lumpectomy.  The periareolar incision is healing nicely, without erythema, dehiscence, or swelling.  The cosmetic result is excellent.  Both axillae are benign.   LAB RESULTS:  CMP     Component Value Date/Time   NA 142 01/14/2019 1410   K 4.0 01/14/2019 1410   CL 100 01/14/2019 1410   CO2 28 01/14/2019 1410   GLUCOSE 105 (H) 01/14/2019 1410   BUN 15 01/14/2019 1410   CREATININE 0.80 01/14/2019 1410   CALCIUM 9.3 01/14/2019 1410   PROT 7.1 07/09/2018 0944   ALBUMIN 3.8 07/09/2018 0944   AST 21 07/09/2018 0944   ALT 22 07/09/2018 0944   ALKPHOS 64 07/09/2018 0944   BILITOT 0.9 07/09/2018 0944   GFRNONAA >60 01/14/2019 1410   GFRAA >60 01/14/2019 1410    No results found for: TOTALPROTELP, ALBUMINELP, A1GS, A2GS, BETS, BETA2SER, GAMS, MSPIKE, SPEI  No results found for: Nils Pyle, [facility]  Lab Results  Component Value Date   WBC 11.7 (H) 07/16/2018   HGB 12.6 07/16/2018   HCT 37.5 07/16/2018   MCV 89.3 07/16/2018   PLT 218 07/16/2018      Chemistry      Component Value Date/Time   NA 142 01/14/2019 1410   K 4.0 01/14/2019 1410   CL 100 01/14/2019 1410   CO2 28 01/14/2019 1410   BUN 15 01/14/2019 1410   CREATININE 0.80 01/14/2019  1410      Component Value Date/Time   CALCIUM 9.3 01/14/2019 1410   ALKPHOS 64 07/09/2018 0944   AST 21 07/09/2018 0944   ALT 22 07/09/2018 0944   BILITOT 0.9 07/09/2018 0944       No results found for: LABCA2  No components found for: LNLGXQ119  No results for input(s): INR in the last 168 hours.  No results found for: LABCA2  No  results found for: CAN199  No results found for: GNF621  No results found for: HYQ657  No results found for: CA2729  No components found for: HGQUANT  No results found for: CEA1 / No results found for: CEA1   No results found for: AFPTUMOR  No results found for: CHROMOGRNA  No results found for: PSA1  No visits with results within 3 Day(s) from this visit.  Latest known visit with results is:  Admission on 01/19/2019, Discharged on 01/19/2019  Component Date Value Ref Range Status  . Sodium 01/14/2019 142  135 - 145 mmol/L Final  . Potassium 01/14/2019 4.0  3.5 - 5.1 mmol/L Final  . Chloride 01/14/2019 100  98 - 111 mmol/L Final  . CO2 01/14/2019 28  22 - 32 mmol/L Final  . Glucose, Bld 01/14/2019 105* 70 - 99 mg/dL Final  . BUN 01/14/2019 15  8 - 23 mg/dL Final  . Creatinine, Ser 01/14/2019 0.80  0.44 - 1.00 mg/dL Final  . Calcium 01/14/2019 9.3  8.9 - 10.3 mg/dL Final  . GFR calc non Af Amer 01/14/2019 >60  >60 mL/min Final  . GFR calc Af Amer 01/14/2019 >60  >60 mL/min Final  . Anion gap 01/14/2019 14  5 - 15 Final   Performed at [facility], [place] [address], [place], [place] [address]    (this displays the last labs from the last 3 days)  No results found for: TOTALPROTELP, ALBUMINELP, A1GS, A2GS, BETS, BETA2SER, GAMS, MSPIKE, SPEI (this displays SPEP labs)  No results found for: KPAFRELGTCHN, LAMBDASER, KAPLAMBRATIO (kappa/lambda light chains)  No results found for: HGBA, HGBA2QUANT, HGBFQUANT, HGBSQUAN (Hemoglobinopathy evaluation)   No results found for: LDH  No results found for: IRON, TIBC, IRONPCTSAT (Iron  and TIBC)  No results found for: FERRITIN  Urinalysis    Component Value Date/Time   COLORURINE YELLOW 07/09/2018 0920   APPEARANCEUR CLEAR 07/09/2018 0920   LABSPEC 1.017 07/09/2018 0920   PHURINE 7.0 07/09/2018 0920   GLUCOSEU NEGATIVE 07/09/2018 0920   HGBUR NEGATIVE 07/09/2018 0920   BILIRUBINUR NEGATIVE 07/09/2018 0920   KETONESUR NEGATIVE 07/09/2018 0920   PROTEINUR NEGATIVE 07/09/2018 0920   NITRITE NEGATIVE 07/09/2018 0920   LEUKOCYTESUR NEGATIVE 07/09/2018 0920     STUDIES: No results found.  ELIGIBLE FOR AVAILABLE RESEARCH PROTOCOL: no  ASSESSMENT: 68 y.o. [place] woman status post left lumpectomy on 01/22/2019 for atypical ductal hyperplasia arising in a complex sclerosing lesion  (1) history of stage IA grade 1 endometrial cancer, requiring no adjuvant therapy.  (2) family history of colon and stomach cancer but MMR normal  (3) breast cancer high risk: opted against risk reduction and intensified screening options  PLAN: I spent approximately 50 minutes face to face with Hibo with more than 50% of that time spent in counseling and coordination of care. Specifically we reviewed the biology of the patient's diagnosis and the specifics of her situation.  Mel understands ADH means, first, relatively unrestricted growth of breast cells and, in addition, some morphologically different features from simple hyperplasia. Atypical ductal hyperplasia can be difficult to tell apart ductal cancer in situ. For those reasons lumpectomy was indicated.  ADH is also a marker of breast cancer risk. The risk of developing breast cancer in patients with ADH approaches 1% per year. The new cancer can develop in either breast. One half of those tumors would be invasive.  Assuming an average life span for women in the [place] today, this means her risk  of developing breast cancer is in the 20-25% range.  This means of course that she has a 75-80% chance of not developing breast  cancer in her remaining life.  There are 2 ways of dealing with this problem.  One is risk reduction.  The other one is intensified screening.  One way to reduce the risk is bilateral mastectomies.  We discussed this and she understands bilateral mastectomies are a very extreme response to this the level threat and would not improve her survival, since if she does develop breast cancer in the future we expect to find an early and be able to cure it  Another approach to risk reduction is antiestrogens. We discussed these agents in detail including their possible toxicities, side effects and complications.  We also discussed the fact that either of these classes of drugs taken for 5 years would cut her risk of breast cancer in half.    A third way to reduce her cancer risk--not specific to breast cancer--is to optimize her diet and exercise routine.  Ideally she would be on a mostly vegetable diet with limited carbohydrates. Meats and dairy are allowed. The exercise goal is 45 minutes 5 times a week of activity vigorous enough to induce perspiration. This would reduce her risk of any cancer developing by between 1 and 3%.    After discussing risk reduction, we discussed intensified screening.  This would include yearly mammography with tomography, biannual breast exams by an MD, and a yearly breast MRI.  Maryjean understands that adding the MRI may increase cost and also put her at risk for false positives, which may lead to unnecessary procedures.  The point of course would be to find any cancer that may develop at the very earliest stages to increase the chance of cure with minimal interventions (specifically without chemotherapy).  We also noted that her breasts are not dense, and in fact her mammogram was sensitive enough to pick up with very subtle distortion.  This makes the possible negatives from yearly MRIs outweigh the possible benefits.  Jolana has a good understanding of her options.  She does not  feel her risk of developing breast cancer warrants any intervention at this point.  She will discuss antiestrogens in detail with her practitioner daughter, and if she decides to try 1 of these agents she will let me know.  Otherwise she will continue with yearly mammography as before and yearly biannual breast exams through her gynecologist and gynecologic oncologist.  I will be glad to see her again at any point in the future if and when the need arises but as of now are making no further routine appointments for her here.  Chauncey Cruel, MD   03/11/2019 4:39 PM Medical Oncology and Hematology [facility] [address] [place], [place] [address] Tel. [phone]    Fax. [phone]

## 2019-04-12 DIAGNOSIS — K30 Functional dyspepsia: Secondary | ICD-10-CM | POA: Diagnosis not present

## 2019-04-12 DIAGNOSIS — E1169 Type 2 diabetes mellitus with other specified complication: Secondary | ICD-10-CM | POA: Diagnosis not present

## 2019-04-12 DIAGNOSIS — E7800 Pure hypercholesterolemia, unspecified: Secondary | ICD-10-CM | POA: Diagnosis not present

## 2019-04-12 DIAGNOSIS — I10 Essential (primary) hypertension: Secondary | ICD-10-CM | POA: Diagnosis not present

## 2019-04-12 DIAGNOSIS — E559 Vitamin D deficiency, unspecified: Secondary | ICD-10-CM | POA: Diagnosis not present

## 2019-04-12 DIAGNOSIS — N6092 Unspecified benign mammary dysplasia of left breast: Secondary | ICD-10-CM | POA: Diagnosis not present

## 2019-07-27 DIAGNOSIS — Z01419 Encounter for gynecological examination (general) (routine) without abnormal findings: Secondary | ICD-10-CM | POA: Diagnosis not present

## 2019-07-27 DIAGNOSIS — R234 Changes in skin texture: Secondary | ICD-10-CM | POA: Diagnosis not present

## 2019-07-27 DIAGNOSIS — Z13820 Encounter for screening for osteoporosis: Secondary | ICD-10-CM | POA: Diagnosis not present

## 2019-07-30 ENCOUNTER — Other Ambulatory Visit: Payer: Self-pay | Admitting: Nurse Practitioner

## 2019-07-30 DIAGNOSIS — R5381 Other malaise: Secondary | ICD-10-CM

## 2019-08-26 ENCOUNTER — Other Ambulatory Visit: Payer: Self-pay | Admitting: Nurse Practitioner

## 2019-08-26 DIAGNOSIS — Z13820 Encounter for screening for osteoporosis: Secondary | ICD-10-CM

## 2019-10-12 DIAGNOSIS — E7800 Pure hypercholesterolemia, unspecified: Secondary | ICD-10-CM | POA: Diagnosis not present

## 2019-10-12 DIAGNOSIS — Z0000 Encounter for general adult medical examination without abnormal findings: Secondary | ICD-10-CM | POA: Diagnosis not present

## 2019-10-12 DIAGNOSIS — I10 Essential (primary) hypertension: Secondary | ICD-10-CM | POA: Diagnosis not present

## 2019-10-12 DIAGNOSIS — K30 Functional dyspepsia: Secondary | ICD-10-CM | POA: Diagnosis not present

## 2019-10-12 DIAGNOSIS — N6092 Unspecified benign mammary dysplasia of left breast: Secondary | ICD-10-CM | POA: Diagnosis not present

## 2019-10-12 DIAGNOSIS — Z23 Encounter for immunization: Secondary | ICD-10-CM | POA: Diagnosis not present

## 2019-10-12 DIAGNOSIS — E1169 Type 2 diabetes mellitus with other specified complication: Secondary | ICD-10-CM | POA: Diagnosis not present

## 2019-10-12 DIAGNOSIS — E559 Vitamin D deficiency, unspecified: Secondary | ICD-10-CM | POA: Diagnosis not present

## 2019-10-14 DIAGNOSIS — M25561 Pain in right knee: Secondary | ICD-10-CM | POA: Diagnosis not present

## 2019-10-18 ENCOUNTER — Other Ambulatory Visit: Payer: Self-pay | Admitting: Family Medicine

## 2019-10-18 DIAGNOSIS — Z1231 Encounter for screening mammogram for malignant neoplasm of breast: Secondary | ICD-10-CM

## 2019-10-21 ENCOUNTER — Other Ambulatory Visit: Payer: Self-pay

## 2019-10-21 ENCOUNTER — Ambulatory Visit
Admission: RE | Admit: 2019-10-21 | Discharge: 2019-10-21 | Disposition: A | Discharge status: Home / Self Care | Payer: Medicare Other | Source: Ambulatory Visit | Attending: Nurse Practitioner | Admitting: Nurse Practitioner

## 2019-10-21 DIAGNOSIS — Z13820 Encounter for screening for osteoporosis: Secondary | ICD-10-CM

## 2019-10-21 DIAGNOSIS — Z780 Asymptomatic menopausal state: Secondary | ICD-10-CM | POA: Diagnosis not present

## 2019-11-17 ENCOUNTER — Telehealth: Payer: Self-pay | Admitting: *Deleted

## 2019-11-17 NOTE — Telephone Encounter (Signed)
Patient called and scheduled her follow up appt for Feb.

## 2019-11-22 ENCOUNTER — Other Ambulatory Visit: Payer: Self-pay | Admitting: Family Medicine

## 2019-11-22 ENCOUNTER — Other Ambulatory Visit: Payer: Self-pay | Admitting: Nurse Practitioner

## 2019-11-22 DIAGNOSIS — N644 Mastodynia: Secondary | ICD-10-CM

## 2019-12-01 ENCOUNTER — Ambulatory Visit
Admission: RE | Admit: 2019-12-01 | Discharge: 2019-12-01 | Disposition: A | Discharge status: Home / Self Care | Payer: Medicare Other | Source: Ambulatory Visit | Attending: Nurse Practitioner | Admitting: Nurse Practitioner

## 2019-12-01 ENCOUNTER — Ambulatory Visit: Payer: Medicare Other

## 2019-12-01 ENCOUNTER — Other Ambulatory Visit: Payer: Self-pay

## 2019-12-01 DIAGNOSIS — N644 Mastodynia: Secondary | ICD-10-CM

## 2019-12-09 ENCOUNTER — Ambulatory Visit: Payer: Medicare Other

## 2019-12-28 DIAGNOSIS — U071 COVID-19: Secondary | ICD-10-CM | POA: Diagnosis not present

## 2019-12-28 DIAGNOSIS — I10 Essential (primary) hypertension: Secondary | ICD-10-CM | POA: Diagnosis not present

## 2020-01-25 DIAGNOSIS — H524 Presbyopia: Secondary | ICD-10-CM | POA: Diagnosis not present

## 2020-01-25 DIAGNOSIS — E119 Type 2 diabetes mellitus without complications: Secondary | ICD-10-CM | POA: Diagnosis not present

## 2020-01-25 DIAGNOSIS — H43813 Vitreous degeneration, bilateral: Secondary | ICD-10-CM | POA: Diagnosis not present

## 2020-01-25 DIAGNOSIS — H2513 Age-related nuclear cataract, bilateral: Secondary | ICD-10-CM | POA: Diagnosis not present

## 2020-01-26 DIAGNOSIS — I10 Essential (primary) hypertension: Secondary | ICD-10-CM | POA: Diagnosis not present

## 2020-02-01 ENCOUNTER — Encounter: Payer: Self-pay | Admitting: Gynecologic Oncology

## 2020-02-01 ENCOUNTER — Inpatient Hospital Stay: Payer: Medicare Other | Attending: Gynecologic Oncology | Admitting: Gynecologic Oncology

## 2020-02-01 ENCOUNTER — Other Ambulatory Visit: Payer: Self-pay

## 2020-02-01 VITALS — BP 116/72 | HR 67 | Temp 98.3°F | Resp 18 | Ht 64.0 in | Wt 217.0 lb

## 2020-02-01 DIAGNOSIS — E785 Hyperlipidemia, unspecified: Secondary | ICD-10-CM | POA: Diagnosis not present

## 2020-02-01 DIAGNOSIS — Z8616 Personal history of COVID-19: Secondary | ICD-10-CM | POA: Diagnosis not present

## 2020-02-01 DIAGNOSIS — Z801 Family history of malignant neoplasm of trachea, bronchus and lung: Secondary | ICD-10-CM | POA: Insufficient documentation

## 2020-02-01 DIAGNOSIS — Z8542 Personal history of malignant neoplasm of other parts of uterus: Secondary | ICD-10-CM | POA: Diagnosis not present

## 2020-02-01 DIAGNOSIS — I10 Essential (primary) hypertension: Secondary | ICD-10-CM | POA: Diagnosis not present

## 2020-02-01 DIAGNOSIS — Z800 Family history of malignant neoplasm of digestive organs: Secondary | ICD-10-CM | POA: Diagnosis not present

## 2020-02-01 DIAGNOSIS — Z08 Encounter for follow-up examination after completed treatment for malignant neoplasm: Secondary | ICD-10-CM | POA: Diagnosis not present

## 2020-02-01 DIAGNOSIS — Z8249 Family history of ischemic heart disease and other diseases of the circulatory system: Secondary | ICD-10-CM | POA: Diagnosis not present

## 2020-02-01 DIAGNOSIS — E7800 Pure hypercholesterolemia, unspecified: Secondary | ICD-10-CM | POA: Diagnosis not present

## 2020-02-01 DIAGNOSIS — Z9079 Acquired absence of other genital organ(s): Secondary | ICD-10-CM | POA: Insufficient documentation

## 2020-02-01 DIAGNOSIS — Z79899 Other long term (current) drug therapy: Secondary | ICD-10-CM | POA: Insufficient documentation

## 2020-02-01 DIAGNOSIS — Z90722 Acquired absence of ovaries, bilateral: Secondary | ICD-10-CM | POA: Diagnosis not present

## 2020-02-01 DIAGNOSIS — Z7982 Long term (current) use of aspirin: Secondary | ICD-10-CM | POA: Insufficient documentation

## 2020-02-01 DIAGNOSIS — C541 Malignant neoplasm of endometrium: Secondary | ICD-10-CM

## 2020-02-01 DIAGNOSIS — Z90710 Acquired absence of both cervix and uterus: Secondary | ICD-10-CM | POA: Diagnosis not present

## 2020-02-01 NOTE — Patient Instructions (Signed)
Please notify Dr Denman George at phone number [phone] if you notice vaginal bleeding, new pelvic or abdominal pains, bloating, feeling full easy, or a change in bladder or bowel function.   Please contact Dr Serita Grit office (at [phone]) in October, 2021 to request an appointment with her for February, 2022. Please see Auma Thongteum in August for a routine gynecologic visit.

## 2020-02-01 NOTE — Progress Notes (Signed)
Follow-up Note: Gyn-Onc  Consult was requested by Dr. Debroah Baller, NP for the evaluation of Katherine Frederick 69 y.o. female  CC:  Chief Complaint  Patient presents with  . endometrial cancer    Assessment/Plan:  [name]  is a 69 y.o.  year old with a history of stage IA grade 1 endometrial cancer, MMR normal.  Pathology revealed low risk factors for recurrence, therefore no adjuvant therapy is recommended according to NCCN guidelines.  I discussed risk for recurrence and typical symptoms encouraged her to notify us of these should they develop between visits.  I recommend she have follow-up every 6 months for 5 years in accordance with NCCN guidelines. Those visits should include symptom assessment, physical exam and pelvic examination. Pap smears are not indicated or recommended in the routine surveillance of endometrial cancer.  She will follow-up with me in 12 months and Auma Thongteum in 6 months   HPI: [name] is a very pleasant 69 year old P2 who is seen in consultation at the request of Dr Nelda Marseille and Delice Bison NP for grade 1 endometrioid endometrial cancer.  The patient has a history of postmenopausal bleeding since approximately 2011.  She has had intermittent biopsies and D&Cs which had always returned as benign polyps.  She had a new episode of bright red vaginal bleeding that was somewhat different in characteristic in June 2019.  This prompted her to be seen by Delice Bison who performed a transvaginal ultrasound scan on June 18, 2018 which revealed a uterus measuring 4.3 x 7.5 x 4.8 cm with an endometrial thickness of 1.6 cm.  The ovaries were grossly normal bilaterally.  An endometrial Pipelle biopsy was then performed on the same day which revealed endometrioid adenocarcinoma FIGO grade 1 arising the background of complex atypical hyperplasia.  Patient otherwise carries a medical history of diet-controlled prediabetes with most  recent HbA1c of 6%.  She has a history of hypertension hypercholesterolemia and vitamin D deficiency.  She has a history of psoriasis.  She has had 2 prior cesarean sections and laparoscopic cholecystectomy laparoscopic tubal ligation but no other abdominal surgeries.  She takes 81 mg of aspirin daily for general cardiovascular health.  Her family history is not remarkable for colorectal, uterine, ovary, or breast cancer.  She also reports symptoms of a lesion that has been followed and somewhat stable for approximately 6 years on the vulva which is somewhat whitish in appearance she takes clobetasol for this.  It is never been biopsied.  On 07/15/18 she underwent robotic assisted total hysterectomy, BSO, SLN biopsy.  Surgery was uncomplicated.  Final pathology revealed a 2.5cm grade 1 endometrioid tumor with no myometrial invasion. There were negative bilateral SLN's. Perineum biopsy was negative.  She was determined to have low risk features and therefore no adjuvant therapy was recommended in accordance with NCCN guidelines.   She was seen for a routine mammogram in December, 2019 and an abnormality was noted. She underwent surgery for this which showed no malignancy, but she did have atypical cells.   Interval Hx:  She developed COVID 19 infection in January, 2021 but is now doing well.   Current Meds:  Outpatient Encounter Medications as of 02/01/2020  Medication Sig  . acetaminophen (TYLENOL) 500 MG tablet Take 1,000 mg by mouth every 6 (six) hours as needed (for pain.).  Marland Kitchen aspirin EC 81 MG tablet Take 81 mg by mouth at bedtime.  . Calcium Carb-Cholecalciferol (CALCIUM 600+D3 PO) Take 1  tablet by mouth daily.  . fluocinonide (LIDEX) 0.05 % external solution Apply 1 application topically daily as needed (for itching/irritation.).   Marland Kitchen hydrochlorothiazide (HYDRODIURIL) 25 MG tablet Take 12.5 mg by mouth daily.   Marland Kitchen losartan (COZAAR) 100 MG tablet Take 100 mg by mouth daily.  . metoprolol  (LOPRESSOR) 50 MG tablet Take 50 mg by mouth 2 (two) times daily.  . Multiple Vitamin (MULITIVITAMIN WITH MINERALS) TABS Take 1 tablet by mouth at bedtime.   . simvastatin (ZOCOR) 20 MG tablet Take 20 mg by mouth every evening.  . Vitamin D, Ergocalciferol, (DRISDOL) 50000 UNITS CAPS Take 50,000 Units by mouth every Monday.    No facility-administered encounter medications on file as of 02/01/2020.    Allergy:  Allergies  Allergen Reactions  . Lisinopril Cough  . Macrodantin Nausea Only and Other (See Comments)    Abdominal pains    Social Hx:   Social History   Socioeconomic History  . Marital status: Widowed    Spouse name: Not on file  . Number of children: Not on file  . Years of education: Not on file  . Highest education level: Not on file  Occupational History  . Not on file  Tobacco Use  . Smoking status: Never Smoker  . Smokeless tobacco: Never Used  Substance and Sexual Activity  . Alcohol use: No  . Drug use: No  . Sexual activity: Not Currently    Birth control/protection: Surgical  Other Topics Concern  . Not on file  Social History Narrative  . Not on file   Social Determinants of Health   Financial Resource Strain:   . Difficulty of Paying Living Expenses: Not on file  Food Insecurity:   . Worried About Charity fundraiser in the Last Year: Not on file  . Ran Out of Food in the Last Year: Not on file  Transportation Needs:   . Lack of Transportation (Medical): Not on file  . Lack of Transportation (Non-Medical): Not on file  Physical Activity:   . Days of Exercise per Week: Not on file  . Minutes of Exercise per Session: Not on file  Stress:   . Feeling of Stress : Not on file  Social Connections:   . Frequency of Communication with Friends and Family: Not on file  . Frequency of Social Gatherings with Friends and Family: Not on file  . Attends Religious Services: Not on file  . Active Member of Clubs or Organizations: Not on file  . Attends  Archivist Meetings: Not on file  . Marital Status: Not on file  Intimate Partner Violence:   . Fear of Current or Ex-Partner: Not on file  . Emotionally Abused: Not on file  . Physically Abused: Not on file  . Sexually Abused: Not on file    Past Surgical Hx:  Past Surgical History:  Procedure Laterality Date  . ABDOMINAL HYSTERECTOMY     laparoscopic Dr. Denman George 07-15-18  . BREAST EXCISIONAL BIOPSY Left [id number]   [facility]  . CESAREAN SECTION     x 2  . CHOLECYSTECTOMY    . COLONOSCOPY  2016   x 3  . DILATATION & CURETTAGE/HYSTEROSCOPY WITH MYOSURE N/A 02/28/2016   Procedure: DILATATION & CURETTAGE/HYSTEROSCOPY WITH MYOSURE;  Surgeon: Janyth Pupa, DO;  Location: [facility];  Service: Gynecology;  Laterality: N/A;  Polypectomy  . HYSTEROSCOPY WITH D & C  02/27/2012   Procedure: DILATATION AND CURETTAGE /HYSTEROSCOPY;  Surgeon: Olga Millers, MD;  Location: [facility];  Service: Gynecology;  Laterality: N/A;  . RADIOACTIVE SEED GUIDED EXCISIONAL BREAST BIOPSY Left 01/19/2019   Procedure: RADIOACTIVE SEED GUIDED EXCISIONAL LEFT BREAST BIOPSY;  Surgeon: Rolm Bookbinder, MD;  Location: [facility];  Service: General;  Laterality: Left;  . ROBOTIC ASSISTED TOTAL HYSTERECTOMY WITH BILATERAL SALPINGO OOPHERECTOMY Bilateral 07/15/2018   Procedure: XI ROBOTIC ASSISTED TOTAL HYSTERECTOMY WITH BILATERAL SALPINGO OOPHORECTOMY WITH SENTINEL LYMPH NODE BIOPSY AND VULVAR BIOPSY;  Surgeon: Everitt Amber, MD;  Location: [facility];  Service: Gynecology;  Laterality: Bilateral;  . TUBAL LIGATION    . VULVA Milagros Loll BIOPSY N/A 07/15/2018   Procedure: VULVAR BIOPSY;  Surgeon: Everitt Amber, MD;  Location: [facility];  Service: Gynecology;  Laterality: N/A;  . WISDOM TOOTH EXTRACTION      Past Medical Hx:  Past Medical History:  Diagnosis Date  . Arthritis    knees  . Cancer [facility])    endometrial cancer   . Hypercholesteremia   . Hyperlipidemia   . Hypertension   . Left breast mass   .  Post-menopausal bleeding   . Pre-diabetes    no meds  . Psoriasis     Past Gynecological History:  C/s x 2, no history of abn paps (last pap in July, 2019 with normal cytology and negative high risk HPV).  Patient's last menstrual period was 02/24/2012.  Family Hx:  Family History  Problem Relation Age of Onset  . Stomach cancer Mother   . Stroke Father   . Heart attack Father   . Congestive Heart Failure Father   . Alzheimer's disease Sister   . Esophageal cancer Brother   . Hypertension Brother   . Hypertension Brother   . Hypertension Brother   . Anesthesia problems Neg Hx   . Hypotension Neg Hx   . Malignant hyperthermia Neg Hx   . Pseudochol deficiency Neg Hx   . Breast cancer Neg Hx     Review of Systems:  Constitutional  Feels well,    ENT Normal appearing ears and nares bilaterally Skin/Breast  No rash, sores, jaundice, itching, dryness Cardiovascular  No chest pain, shortness of breath, or edema  Pulmonary  No cough or wheeze.  Gastro Intestinal  No nausea, vomitting, or diarrhoea. No bright red blood per rectum, no abdominal pain, change in bowel movement, or constipation.  Genito Urinary  No frequency, urgency, dysuria, no bleeding Musculo Skeletal  No myalgia, arthralgia, joint swelling or pain  Neurologic  No weakness, numbness, change in gait,  Psychology  No depression, anxiety, insomnia.   Vitals:  Blood pressure 116/72, pulse 67, temperature 98.3 F (36.8 C), temperature source Temporal, resp. rate 18, height '5\' 4"'$  (1.626 m), weight 217 lb (98.4 kg), last menstrual period 02/24/2012, SpO2 100 %.  Physical Exam: WD in NAD Neck  Supple NROM, without any enlargements.  Lymph Node Survey No cervical supraclavicular or inguinal adenopathy Cardiovascular  Pulse normal rate, regularity and rhythm. S1 and S2 normal.  Lungs  Clear to auscultation bilateraly, without wheezes/crackles/rhonchi. Good air movement.  Skin  No rash/lesions/breakdown   Psychiatry  Alert and oriented to person, place, and time  Abdomen  Normoactive bowel sounds, abdomen soft, non-tender and obese without evidence of hernia. Incisions soft. Back No CVA tenderness Genito Urinary  Vaginal cuff smooth, no masses, no lesions, no bleeding. Perineum in tact and healed. Rectal  deferred  Extremities  No bilateral cyanosis, clubbing or edema.  Thereasa Solo, MD  02/01/2020, 3:47 PM

## 2020-02-03 ENCOUNTER — Ambulatory Visit: Payer: Medicare Other | Admitting: Gynecologic Oncology

## 2020-04-10 DIAGNOSIS — E7800 Pure hypercholesterolemia, unspecified: Secondary | ICD-10-CM | POA: Diagnosis not present

## 2020-04-10 DIAGNOSIS — I10 Essential (primary) hypertension: Secondary | ICD-10-CM | POA: Diagnosis not present

## 2020-04-10 DIAGNOSIS — K30 Functional dyspepsia: Secondary | ICD-10-CM | POA: Diagnosis not present

## 2020-04-10 DIAGNOSIS — E1169 Type 2 diabetes mellitus with other specified complication: Secondary | ICD-10-CM | POA: Diagnosis not present

## 2020-04-10 DIAGNOSIS — N6092 Unspecified benign mammary dysplasia of left breast: Secondary | ICD-10-CM | POA: Diagnosis not present

## 2020-04-10 DIAGNOSIS — E559 Vitamin D deficiency, unspecified: Secondary | ICD-10-CM | POA: Diagnosis not present

## 2020-04-13 ENCOUNTER — Ambulatory Visit: Payer: Medicare Other | Attending: Internal Medicine

## 2020-04-13 DIAGNOSIS — Z23 Encounter for immunization: Secondary | ICD-10-CM

## 2020-04-13 NOTE — Progress Notes (Signed)
   Covid-19 Vaccination Clinic  Name:  Javonte Achterberg    MRN: [id number] DOB: 10/03/1951  04/13/2020  Ms. Crosby was observed post Covid-19 immunization for 15 minutes without incident. She was provided with Vaccine Information Sheet and instruction to access the V-Safe system.   Ms. Tavarez was instructed to call 911 with any severe reactions post vaccine: Marland Kitchen Difficulty breathing  . Swelling of face and throat  . A fast heartbeat  . A bad rash all over body  . Dizziness and weakness   Immunizations Administered    Name Date Dose VIS Date Route   Pfizer COVID-19 Vaccine 04/13/2020 11:10 AM 0.3 mL 02/02/2019 Intramuscular   Manufacturer: [place]   Lot: [id number]   [place]: [id number]

## 2020-05-09 ENCOUNTER — Ambulatory Visit: Payer: Medicare Other | Attending: Internal Medicine

## 2020-05-09 DIAGNOSIS — Z23 Encounter for immunization: Secondary | ICD-10-CM

## 2020-05-09 NOTE — Progress Notes (Signed)
   Covid-19 Vaccination Clinic  Name:  Katherine Frederick    MRN: [id number] DOB: Jun 11, 1951  05/09/2020  [name] was observed post Covid-19 immunization for 15 minutes without incident. She was provided with Vaccine Information Sheet and instruction to access the V-Safe system.   [name] was instructed to call 911 with any severe reactions post vaccine: Marland Kitchen Difficulty breathing  . Swelling of face and throat  . A fast heartbeat  . A bad rash all over body  . Dizziness and weakness   Immunizations Administered    Name Date Dose VIS Date Route   Pfizer COVID-19 Vaccine 05/09/2020 10:55 AM 0.3 mL 02/02/2019 Intramuscular   Manufacturer: [place]   Lot: [id number]   [place]: [id number]

## 2020-05-12 DIAGNOSIS — N289 Disorder of kidney and ureter, unspecified: Secondary | ICD-10-CM | POA: Diagnosis not present

## 2020-07-07 DIAGNOSIS — I10 Essential (primary) hypertension: Secondary | ICD-10-CM | POA: Diagnosis not present

## 2020-08-02 DIAGNOSIS — Z9189 Other specified personal risk factors, not elsewhere classified: Secondary | ICD-10-CM | POA: Diagnosis not present

## 2020-08-02 DIAGNOSIS — C55 Malignant neoplasm of uterus, part unspecified: Secondary | ICD-10-CM | POA: Diagnosis not present

## 2020-08-02 DIAGNOSIS — N644 Mastodynia: Secondary | ICD-10-CM | POA: Diagnosis not present

## 2020-09-20 ENCOUNTER — Other Ambulatory Visit: Payer: Self-pay | Admitting: Family Medicine

## 2020-09-20 DIAGNOSIS — Z1231 Encounter for screening mammogram for malignant neoplasm of breast: Secondary | ICD-10-CM

## 2020-10-12 IMAGING — MG STEREOTACTIC CORE NEEDLE BIOPSY
8 of 11 series · 8 of 19 positions shown · non-contrast
Comparison: Previous exams.

Addendum:
CLINICAL DATA: Left breast distortion.

EXAM:
LEFT BREAST STEREOTACTIC CORE NEEDLE BIOPSY

[L (1 of 6)]
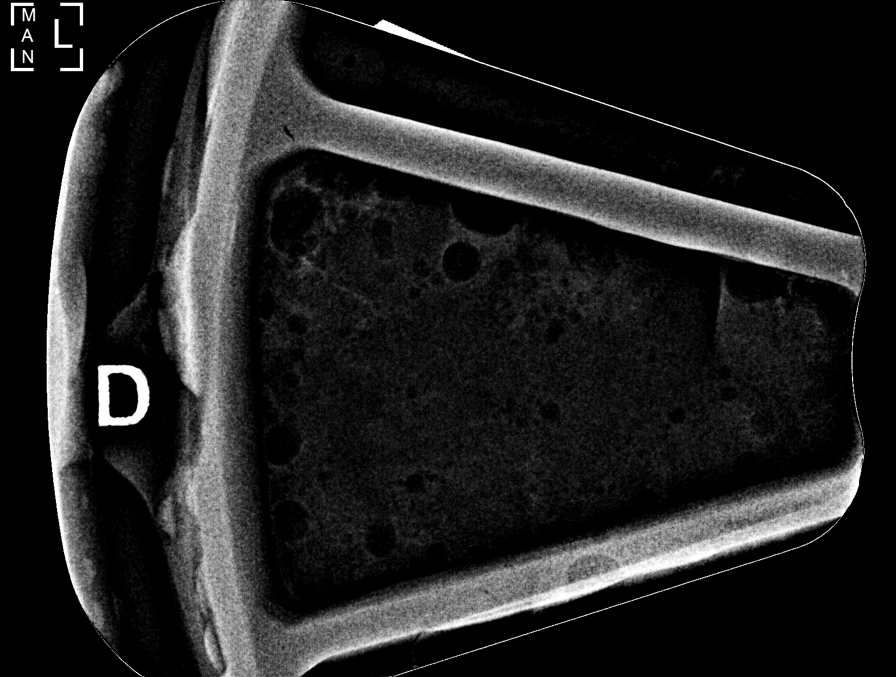

[L (2 of 6)]
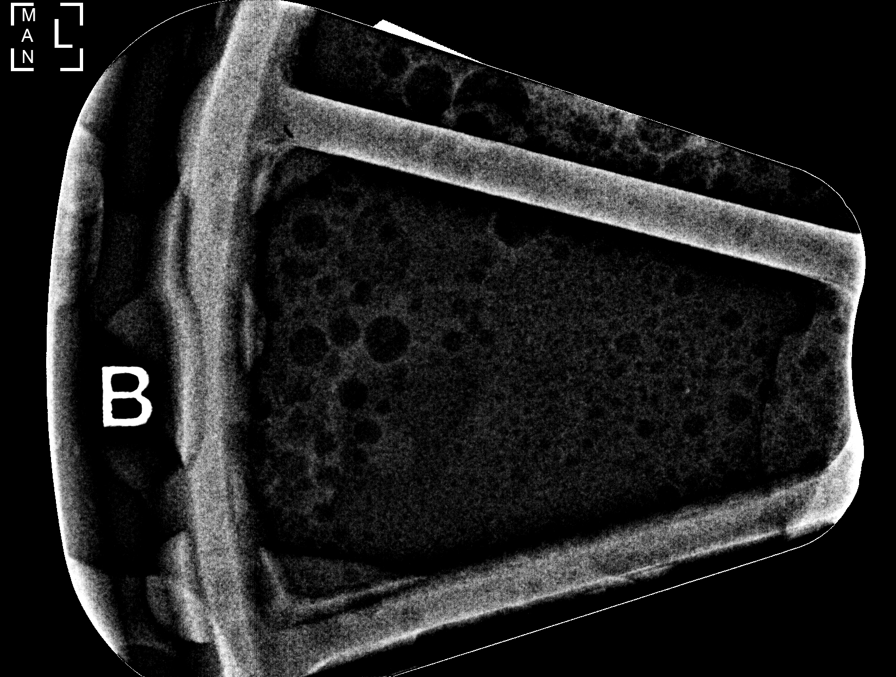

[L (3 of 6)]
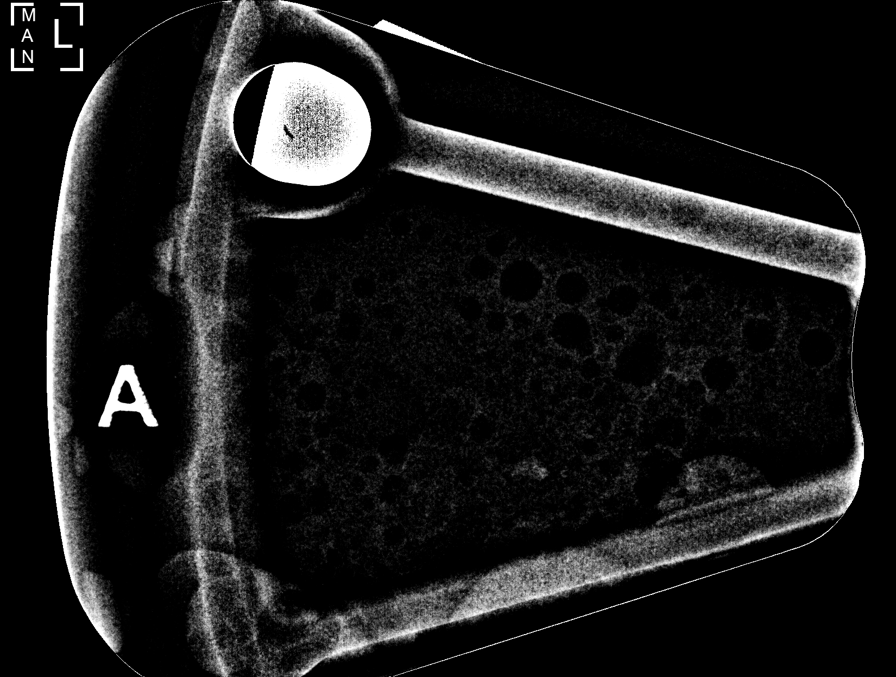

[L (4 of 6)]
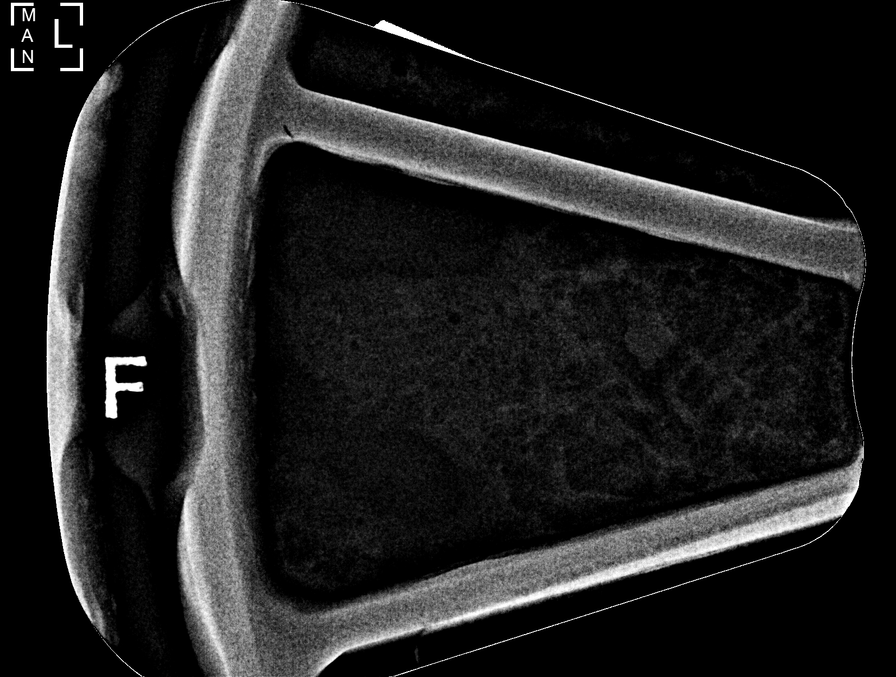

[L (5 of 6)]
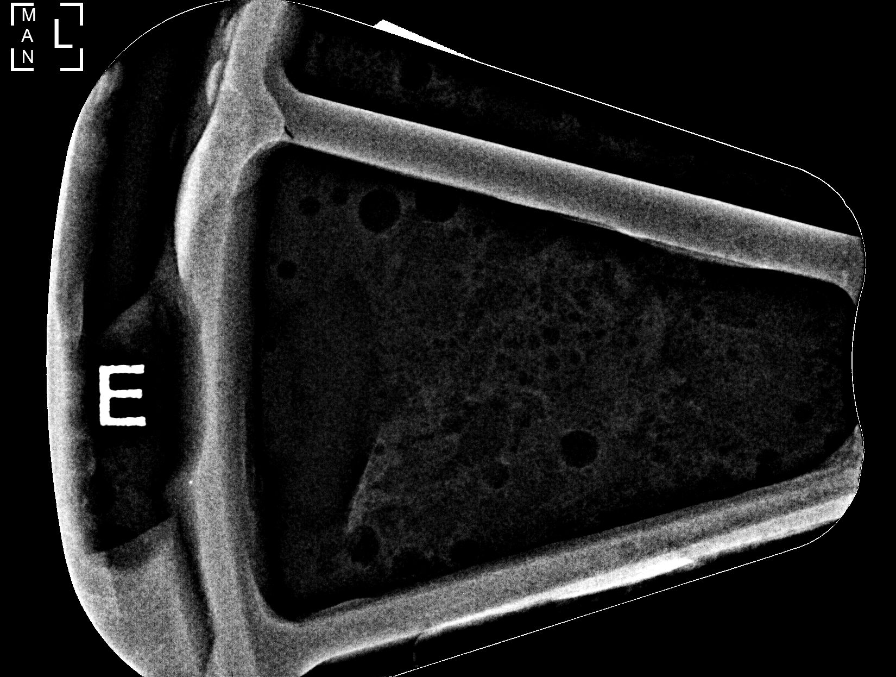

[L (6 of 6)]
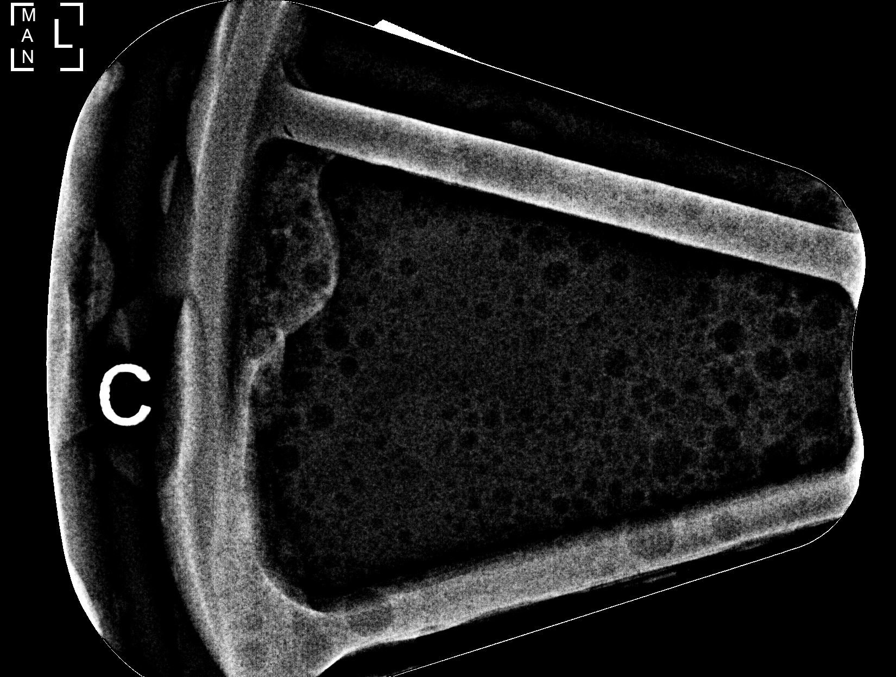

[L CC (1 of 2)]
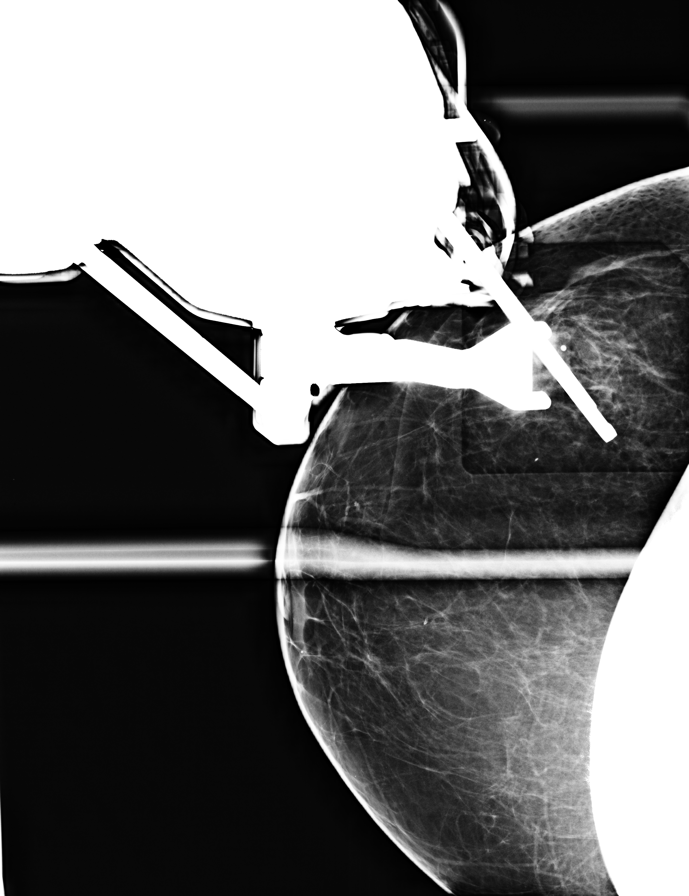

[L CC (2 of 2)]
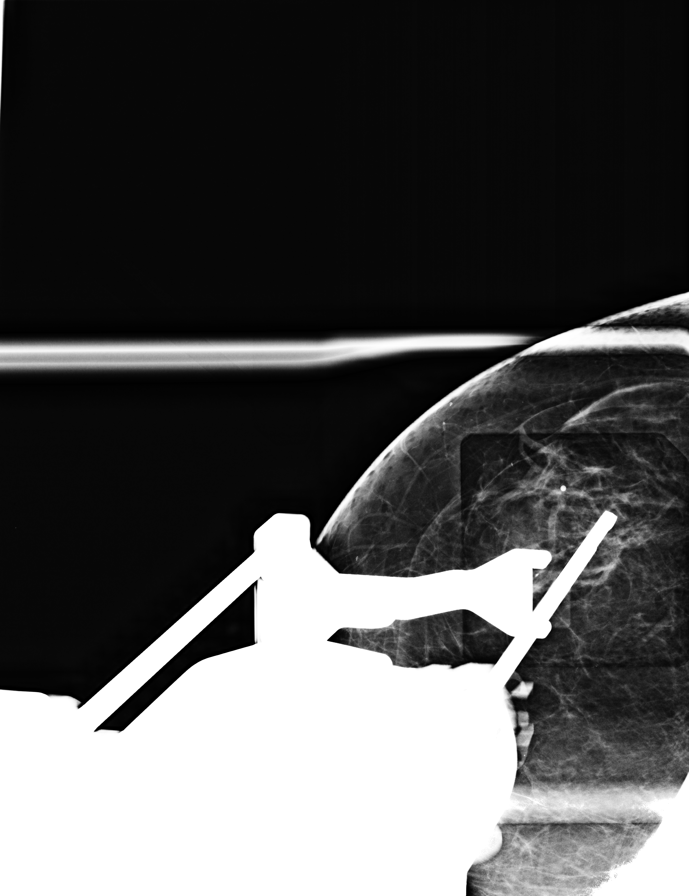

[8 of 19 positions shown; findings below may reference images not displayed]



Using sterile technique and 1% lidocaine 1% lidocaine with
epinephrine as local anesthetic, under stereotactic guidance, a 9
gauge vacuum assisted device was used to perform core needle biopsy
of distortion in the upper-inner quadrant of the left breast using a
superior to inferior approach.

Lesion quadrant: Upper inner quadrant

At the conclusion of the procedure, a coil shaped tissue marker clip
was deployed into the biopsy cavity. Follow-up 2-view mammogram was
performed and dictated separately.
IMPRESSION: Stereotactic-guided biopsy of the left breast. No apparent
complications.

ADDENDUM:
Pathology revealed COMPLEX SCLEROSING LESION of the Left breast
upper inner quadrant. This was found to be concordant by Dr. Beruze
Migue, with excision recommended.

Pathology results were discussed with the patient by telephone. The
patient reported doing well after the biopsy with tenderness at the
site. Post biopsy instructions and care were reviewed and questions
were answered. The patient was encouraged to call The [REDACTED]

Surgical consultation has been arranged with Dr. Raad Yamamoto,
per patient request, at [REDACTED] on January 07, 2018.

Pathology results reported by Ebi Au, RN on 12/15/2018.

*** End of Addendum ***

## 2020-10-24 DIAGNOSIS — Z23 Encounter for immunization: Secondary | ICD-10-CM | POA: Diagnosis not present

## 2020-10-24 DIAGNOSIS — Z0000 Encounter for general adult medical examination without abnormal findings: Secondary | ICD-10-CM | POA: Diagnosis not present

## 2020-10-24 DIAGNOSIS — K30 Functional dyspepsia: Secondary | ICD-10-CM | POA: Diagnosis not present

## 2020-10-24 DIAGNOSIS — E1169 Type 2 diabetes mellitus with other specified complication: Secondary | ICD-10-CM | POA: Diagnosis not present

## 2020-10-24 DIAGNOSIS — I10 Essential (primary) hypertension: Secondary | ICD-10-CM | POA: Diagnosis not present

## 2020-10-24 DIAGNOSIS — E559 Vitamin D deficiency, unspecified: Secondary | ICD-10-CM | POA: Diagnosis not present

## 2020-10-24 DIAGNOSIS — E7800 Pure hypercholesterolemia, unspecified: Secondary | ICD-10-CM | POA: Diagnosis not present

## 2020-10-24 DIAGNOSIS — Z1389 Encounter for screening for other disorder: Secondary | ICD-10-CM | POA: Diagnosis not present

## 2020-10-24 DIAGNOSIS — N6092 Unspecified benign mammary dysplasia of left breast: Secondary | ICD-10-CM | POA: Diagnosis not present

## 2020-12-04 ENCOUNTER — Ambulatory Visit: Payer: Medicare Other

## 2020-12-06 ENCOUNTER — Ambulatory Visit
Admission: RE | Admit: 2020-12-06 | Discharge: 2020-12-06 | Disposition: A | Discharge status: Home / Self Care | Payer: Medicare Other | Source: Ambulatory Visit | Attending: Family Medicine | Admitting: Family Medicine

## 2020-12-06 ENCOUNTER — Other Ambulatory Visit: Payer: Self-pay

## 2020-12-06 DIAGNOSIS — Z1231 Encounter for screening mammogram for malignant neoplasm of breast: Secondary | ICD-10-CM

## 2020-12-14 DIAGNOSIS — Z23 Encounter for immunization: Secondary | ICD-10-CM | POA: Diagnosis not present

## 2020-12-20 DIAGNOSIS — Z1152 Encounter for screening for COVID-19: Secondary | ICD-10-CM | POA: Diagnosis not present

## 2021-01-26 DIAGNOSIS — E119 Type 2 diabetes mellitus without complications: Secondary | ICD-10-CM | POA: Diagnosis not present

## 2021-01-26 DIAGNOSIS — H25011 Cortical age-related cataract, right eye: Secondary | ICD-10-CM | POA: Diagnosis not present

## 2021-01-26 DIAGNOSIS — H524 Presbyopia: Secondary | ICD-10-CM | POA: Diagnosis not present

## 2021-01-26 DIAGNOSIS — H2513 Age-related nuclear cataract, bilateral: Secondary | ICD-10-CM | POA: Diagnosis not present

## 2021-01-31 ENCOUNTER — Encounter: Payer: Self-pay | Admitting: Gynecologic Oncology

## 2021-02-02 ENCOUNTER — Other Ambulatory Visit: Payer: Self-pay

## 2021-02-02 ENCOUNTER — Inpatient Hospital Stay: Payer: Medicare Other | Attending: Gynecologic Oncology | Admitting: Gynecologic Oncology

## 2021-02-02 ENCOUNTER — Encounter: Payer: Self-pay | Admitting: Gynecologic Oncology

## 2021-02-02 VITALS — BP 132/54 | HR 61 | Temp 97.1°F | Resp 16 | Ht 65.0 in | Wt 219.0 lb

## 2021-02-02 DIAGNOSIS — I10 Essential (primary) hypertension: Secondary | ICD-10-CM | POA: Insufficient documentation

## 2021-02-02 DIAGNOSIS — Z7982 Long term (current) use of aspirin: Secondary | ICD-10-CM | POA: Diagnosis not present

## 2021-02-02 DIAGNOSIS — Z90722 Acquired absence of ovaries, bilateral: Secondary | ICD-10-CM | POA: Diagnosis not present

## 2021-02-02 DIAGNOSIS — E7800 Pure hypercholesterolemia, unspecified: Secondary | ICD-10-CM | POA: Diagnosis not present

## 2021-02-02 DIAGNOSIS — E785 Hyperlipidemia, unspecified: Secondary | ICD-10-CM | POA: Diagnosis not present

## 2021-02-02 DIAGNOSIS — C541 Malignant neoplasm of endometrium: Secondary | ICD-10-CM | POA: Diagnosis not present

## 2021-02-02 DIAGNOSIS — M1990 Unspecified osteoarthritis, unspecified site: Secondary | ICD-10-CM | POA: Diagnosis not present

## 2021-02-02 DIAGNOSIS — Z90710 Acquired absence of both cervix and uterus: Secondary | ICD-10-CM | POA: Insufficient documentation

## 2021-02-02 DIAGNOSIS — Z79899 Other long term (current) drug therapy: Secondary | ICD-10-CM | POA: Diagnosis not present

## 2021-02-02 NOTE — Patient Instructions (Signed)
Please notify Dr Denman George at phone number [phone] if you notice vaginal bleeding, new pelvic or abdominal pains, bloating, feeling full easy, or a change in bladder or bowel function.   Please contact Dr Serita Grit office (at [phone]) in November, 2022 to request an appointment with her for February, 2023.

## 2021-02-02 NOTE — Progress Notes (Signed)
Follow-up Note: Gyn-Onc  Consult was requested by Dr. Debroah Baller, NP for the evaluation of Katherine Frederick 70 y.o. female  CC:  Chief Complaint  Patient presents with  . Endometrial cancer [facility])    Assessment/Plan:  [name]  is a 70 y.o.  year old with a history of stage IA grade 1 endometrial cancer, MMR normal s/p staging surgery in August, 2019.  Pathology revealed low risk factors for recurrence, therefore no adjuvant therapy is recommended according to NCCN guidelines.  I discussed risk for recurrence and typical symptoms encouraged her to notify us of these should they develop between visits.  I recommend she have follow-up every 6 months for 5 years in accordance with NCCN guidelines. Those visits should include symptom assessment, physical exam and pelvic examination. Pap smears are not indicated or recommended in the routine surveillance of endometrial cancer.  She will follow-up with me in 12 months and with her NP in August, 2022.    HPI: [name] is a very pleasant 70 year old P2 who is seen in consultation at the request of Dr Katherine Frederick and Katherine Bison NP for grade 1 endometrioid endometrial cancer.  The patient has a history of postmenopausal bleeding since approximately 2011.  She has had intermittent biopsies and D&Cs which had always returned as benign polyps.  She had a new episode of bright red vaginal bleeding that was somewhat different in characteristic in June 2019.  This prompted her to be seen by Katherine Frederick who performed a transvaginal ultrasound scan on June 18, 2018 which revealed a uterus measuring 4.3 x 7.5 x 4.8 cm with an endometrial thickness of 1.6 cm.  The ovaries were grossly normal bilaterally.  An endometrial Pipelle biopsy was then performed on the same day which revealed endometrioid adenocarcinoma FIGO grade 1 arising the background of complex atypical hyperplasia.  On 07/15/18 she underwent robotic assisted  total hysterectomy, BSO, SLN biopsy.  Surgery was uncomplicated.  Final pathology revealed a 2.5cm grade 1 endometrioid tumor with no myometrial invasion. There were negative bilateral SLN's. Perineum biopsy was negative.  She was determined to have low risk features and therefore no adjuvant therapy was recommended in accordance with NCCN guidelines.   She was seen for a routine mammogram in December, 2019 and an abnormality was noted. She underwent surgery for this which showed no malignancy, but she did have atypical cells.   Interval Hx:  She has no symptoms concerning for recurrence.  Current Meds:  Outpatient Encounter Medications as of 02/02/2021  Medication Sig  . acetaminophen (TYLENOL) 500 MG tablet Take 1,000 mg by mouth every 6 (six) hours as needed (for pain.).  Marland Kitchen amLODipine (NORVASC) 5 MG tablet Take 5 mg by mouth daily.  Marland Kitchen aspirin EC 81 MG tablet Take 81 mg by mouth at bedtime.  . Calcium Carb-Cholecalciferol (CALCIUM 600+D3 PO) Take 1 tablet by mouth daily.  Marland Kitchen losartan (COZAAR) 100 MG tablet Take 100 mg by mouth daily.  . metoprolol (LOPRESSOR) 50 MG tablet Take 50 mg by mouth 2 (two) times daily.  . Multiple Vitamin (MULITIVITAMIN WITH MINERALS) TABS Take 1 tablet by mouth at bedtime.   . simvastatin (ZOCOR) 20 MG tablet Take 20 mg by mouth every evening.  Marland Kitchen spironolactone (ALDACTONE) 25 MG tablet Take 25 mg by mouth daily.  . Vitamin D, Ergocalciferol, (DRISDOL) 50000 UNITS CAPS Take 50,000 Units by mouth every Monday.   . famotidine (PEPCID) 20 MG tablet Take 20 mg by  mouth 2 (two) times daily.  . fluocinonide (LIDEX) 0.05 % external solution Apply 1 application topically daily as needed (for itching/irritation.).  (Patient not taking: Reported on 01/31/2021)  . [DISCONTINUED] hydrochlorothiazide (HYDRODIURIL) 25 MG tablet Take 12.5 mg by mouth daily.    No facility-administered encounter medications on file as of 02/02/2021.    Allergy:  Allergies  Allergen  Reactions  . Lisinopril Cough  . Quinapril Hcl Other (See Comments)    Cough   . Triamterene-Hctz Other (See Comments)    Decreased renal function.Stomach pain   . Macrodantin Nausea Only and Other (See Comments)    Abdominal pains    Social Hx:   Social History   Socioeconomic History  . Marital status: Widowed    Spouse name: Not on file  . Number of children: Not on file  . Years of education: Not on file  . Highest education level: Not on file  Occupational History  . Not on file  Tobacco Use  . Smoking status: Never Smoker  . Smokeless tobacco: Never Used  Vaping Use  . Vaping Use: Never used  Substance and Sexual Activity  . Alcohol use: No  . Drug use: No  . Sexual activity: Not Currently    Birth control/protection: Surgical  Other Topics Concern  . Not on file  Social History Narrative  . Not on file   Social Determinants of Health   Financial Resource Strain: Not on file  Food Insecurity: Not on file  Transportation Needs: Not on file  Physical Activity: Not on file  Stress: Not on file  Social Connections: Not on file  Intimate Partner Violence: Not on file    Past Surgical Hx:  Past Surgical History:  Procedure Laterality Date  . ABDOMINAL HYSTERECTOMY     laparoscopic Dr. Denman Frederick 07-15-18  . BREAST EXCISIONAL BIOPSY Left 2112020   Katherine Frederick  . CESAREAN SECTION     x 2  . CHOLECYSTECTOMY    . COLONOSCOPY  2016   x 3  . DILATATION & CURETTAGE/HYSTEROSCOPY WITH MYOSURE N/A 02/28/2016   Procedure: DILATATION & CURETTAGE/HYSTEROSCOPY WITH MYOSURE;  Surgeon: Katherine Pupa, DO;  [facility];  Service: Gynecology;  Laterality: N/A;  Polypectomy  . HYSTEROSCOPY WITH D & C  02/27/2012   Procedure: DILATATION AND CURETTAGE /HYSTEROSCOPY;  Surgeon: Katherine Millers, MD;  [facility];  Service: Gynecology;  Laterality: N/A;  . RADIOACTIVE SEED GUIDED EXCISIONAL BREAST BIOPSY Left 01/19/2019   Procedure: RADIOACTIVE SEED GUIDED EXCISIONAL LEFT BREAST  BIOPSY;  Surgeon: Katherine Bookbinder, MD;  Location: [facility];  Service: General;  Laterality: Left;  . ROBOTIC ASSISTED TOTAL HYSTERECTOMY WITH BILATERAL SALPINGO OOPHERECTOMY Bilateral 07/15/2018   Procedure: XI ROBOTIC ASSISTED TOTAL HYSTERECTOMY WITH BILATERAL SALPINGO OOPHORECTOMY WITH SENTINEL LYMPH NODE BIOPSY AND VULVAR BIOPSY;  Surgeon: Everitt Amber, MD;  Location: [facility];  Service: Gynecology;  Laterality: Bilateral;  . TUBAL LIGATION    . VULVA Milagros Loll BIOPSY N/A 07/15/2018   Procedure: VULVAR BIOPSY;  Surgeon: Everitt Amber, MD;  Location: [facility];  Service: Gynecology;  Laterality: N/A;  . WISDOM TOOTH EXTRACTION      Past Medical Hx:  Past Medical History:  Diagnosis Date  . Arthritis    knees  . Cancer [facility])    endometrial cancer   . Hypercholesteremia   . Hyperlipidemia   . Hypertension   . Left breast mass   . Post-menopausal bleeding   . Pre-diabetes    no meds  .  Psoriasis     Past Gynecological History:  C/s x 2, no history of abn paps (last pap in July, 2019 with normal cytology and negative high risk HPV).  Patient's last menstrual period was 02/24/2012.  Family Hx:  Family History  Problem Relation Age of Onset  . Stomach cancer Mother   . Stroke Father   . Heart attack Father   . Congestive Heart Failure Father   . Alzheimer's disease Sister   . Esophageal cancer Brother   . Hypertension Brother   . Hypertension Brother   . Hypertension Brother   . Anesthesia problems Neg Hx   . Hypotension Neg Hx   . Malignant hyperthermia Neg Hx   . Pseudochol deficiency Neg Hx   . Breast cancer Neg Hx     Review of Systems:  Constitutional  Feels well,    ENT Normal appearing ears and nares bilaterally Skin/Breast  No rash, sores, jaundice, itching, dryness Cardiovascular  No chest pain, shortness of breath, or edema  Pulmonary  No cough or wheeze.  Gastro Intestinal  No nausea, vomitting, or diarrhoea. No bright red blood per rectum,  no abdominal pain, change in bowel movement, or constipation.  Genito Urinary  No frequency, urgency, dysuria, no bleeding Musculo Skeletal  No myalgia, arthralgia, joint swelling or pain  Neurologic  No weakness, numbness, change in gait,  Psychology  No depression, anxiety, insomnia.   Vitals:  Blood pressure (!) 132/54, pulse 61, temperature (!) 97.1 F (36.2 C), temperature source Tympanic, resp. rate 16, height $RemoveBe'5\' 5"'eGERhPGmr$  (1.651 m), weight 219 lb (99.3 kg), last menstrual period 02/24/2012, SpO2 100 %.  Physical Exam: WD in NAD Neck  Supple NROM, without any enlargements.  Lymph Node Survey No cervical supraclavicular or inguinal adenopathy Cardiovascular  Pulse normal rate, regularity and rhythm. S1 and S2 normal.  Lungs  Clear to auscultation bilateraly, without wheezes/crackles/rhonchi. Good air movement.  Skin  No rash/lesions/breakdown  Psychiatry  Alert and oriented to person, place, and time  Abdomen  Normoactive bowel sounds, abdomen soft, non-tender and obese without evidence of hernia. Incisions soft. Back No CVA tenderness Genito Urinary  Vaginal cuff smooth, no masses, no lesions, no bleeding. Rectal  deferred  Extremities  No bilateral cyanosis, clubbing or edema.  Thereasa Solo, MD  02/02/2021, 1:30 PM

## 2021-04-12 DIAGNOSIS — N6320 Unspecified lump in the left breast, unspecified quadrant: Secondary | ICD-10-CM | POA: Diagnosis not present

## 2021-04-13 ENCOUNTER — Other Ambulatory Visit: Payer: Self-pay | Admitting: Obstetrics and Gynecology

## 2021-04-13 DIAGNOSIS — N6320 Unspecified lump in the left breast, unspecified quadrant: Secondary | ICD-10-CM

## 2021-04-24 DIAGNOSIS — E7800 Pure hypercholesterolemia, unspecified: Secondary | ICD-10-CM | POA: Diagnosis not present

## 2021-04-24 DIAGNOSIS — E559 Vitamin D deficiency, unspecified: Secondary | ICD-10-CM | POA: Diagnosis not present

## 2021-04-24 DIAGNOSIS — E1169 Type 2 diabetes mellitus with other specified complication: Secondary | ICD-10-CM | POA: Diagnosis not present

## 2021-04-24 DIAGNOSIS — K30 Functional dyspepsia: Secondary | ICD-10-CM | POA: Diagnosis not present

## 2021-04-24 DIAGNOSIS — I10 Essential (primary) hypertension: Secondary | ICD-10-CM | POA: Diagnosis not present

## 2021-05-18 ENCOUNTER — Ambulatory Visit
Admission: RE | Admit: 2021-05-18 | Discharge: 2021-05-18 | Disposition: A | Discharge status: Home / Self Care | Payer: Medicare Other | Source: Ambulatory Visit | Attending: Obstetrics and Gynecology | Admitting: Obstetrics and Gynecology

## 2021-05-18 ENCOUNTER — Other Ambulatory Visit: Payer: Self-pay

## 2021-05-18 DIAGNOSIS — N6320 Unspecified lump in the left breast, unspecified quadrant: Secondary | ICD-10-CM

## 2021-05-18 DIAGNOSIS — R922 Inconclusive mammogram: Secondary | ICD-10-CM | POA: Diagnosis not present

## 2021-07-27 DIAGNOSIS — E1169 Type 2 diabetes mellitus with other specified complication: Secondary | ICD-10-CM | POA: Diagnosis not present

## 2021-08-07 DIAGNOSIS — Z8542 Personal history of malignant neoplasm of other parts of uterus: Secondary | ICD-10-CM | POA: Diagnosis not present

## 2021-08-07 DIAGNOSIS — Z9189 Other specified personal risk factors, not elsewhere classified: Secondary | ICD-10-CM | POA: Diagnosis not present

## 2021-09-18 ENCOUNTER — Other Ambulatory Visit: Payer: Self-pay | Admitting: Family Medicine

## 2021-09-18 DIAGNOSIS — Z1231 Encounter for screening mammogram for malignant neoplasm of breast: Secondary | ICD-10-CM

## 2021-10-30 DIAGNOSIS — E7800 Pure hypercholesterolemia, unspecified: Secondary | ICD-10-CM | POA: Diagnosis not present

## 2021-10-30 DIAGNOSIS — Z0000 Encounter for general adult medical examination without abnormal findings: Secondary | ICD-10-CM | POA: Diagnosis not present

## 2021-10-30 DIAGNOSIS — K30 Functional dyspepsia: Secondary | ICD-10-CM | POA: Diagnosis not present

## 2021-10-30 DIAGNOSIS — I10 Essential (primary) hypertension: Secondary | ICD-10-CM | POA: Diagnosis not present

## 2021-10-30 DIAGNOSIS — Z1389 Encounter for screening for other disorder: Secondary | ICD-10-CM | POA: Diagnosis not present

## 2021-10-30 DIAGNOSIS — Z23 Encounter for immunization: Secondary | ICD-10-CM | POA: Diagnosis not present

## 2021-10-30 DIAGNOSIS — E1169 Type 2 diabetes mellitus with other specified complication: Secondary | ICD-10-CM | POA: Diagnosis not present

## 2021-10-30 DIAGNOSIS — E559 Vitamin D deficiency, unspecified: Secondary | ICD-10-CM | POA: Diagnosis not present

## 2021-12-07 ENCOUNTER — Ambulatory Visit
Admission: RE | Admit: 2021-12-07 | Discharge: 2021-12-07 | Disposition: A | Discharge status: Home / Self Care | Payer: Medicare Other | Source: Ambulatory Visit | Attending: Family Medicine | Admitting: Family Medicine

## 2021-12-07 DIAGNOSIS — Z1231 Encounter for screening mammogram for malignant neoplasm of breast: Secondary | ICD-10-CM

## 2021-12-28 DIAGNOSIS — H2000 Unspecified acute and subacute iridocyclitis: Secondary | ICD-10-CM | POA: Diagnosis not present

## 2021-12-28 DIAGNOSIS — H15101 Unspecified episcleritis, right eye: Secondary | ICD-10-CM | POA: Diagnosis not present

## 2022-01-02 DIAGNOSIS — H2000 Unspecified acute and subacute iridocyclitis: Secondary | ICD-10-CM | POA: Diagnosis not present

## 2022-01-03 ENCOUNTER — Telehealth: Payer: Self-pay | Admitting: *Deleted

## 2022-01-03 NOTE — Telephone Encounter (Signed)
Patient called and scheduled a follow up in March

## 2022-01-29 DIAGNOSIS — E119 Type 2 diabetes mellitus without complications: Secondary | ICD-10-CM | POA: Diagnosis not present

## 2022-01-29 DIAGNOSIS — H524 Presbyopia: Secondary | ICD-10-CM | POA: Diagnosis not present

## 2022-01-29 DIAGNOSIS — H43813 Vitreous degeneration, bilateral: Secondary | ICD-10-CM | POA: Diagnosis not present

## 2022-01-29 DIAGNOSIS — H2513 Age-related nuclear cataract, bilateral: Secondary | ICD-10-CM | POA: Diagnosis not present

## 2022-02-12 ENCOUNTER — Encounter: Payer: Self-pay | Admitting: Obstetrics & Gynecology

## 2022-02-13 NOTE — Progress Notes (Signed)
Follow Up Note: Gyn-Onc ? ?Katherine Frederick 71 y.o. female ? ?CC: She returns for a f/u visit ? ? ?HPI: The oncology history was reviewed. ? ?Interval History: She denies any vaginal bleeding, abdominal/pelvic pain, cough, lethargy or increasing abdominal girth.  ? ?Review of Systems  ?Review of Systems  ?Constitutional:  Negative for malaise/fatigue and weight loss.  ?Respiratory:  Negative for shortness of breath and wheezing.   ?Cardiovascular:  Negative for chest pain and leg swelling.  ?Gastrointestinal:  Negative for abdominal pain, blood in stool, constipation, nausea and vomiting.  ?Genitourinary:  Negative for dysuria, frequency, hematuria and urgency.  ?Musculoskeletal:  Negative for joint pain and myalgias.  ?Neurological:  Negative for weakness.  ?Psychiatric/Behavioral:  Negative for depression. The patient does not have insomnia.   ? ?Current medications, allergy, social history, past surgical history, past medical history, family history were all reviewed. ? ? ? ?Vitals:  BP (!) 126/58 (BP Location: Left Arm, Patient Position: Sitting)   Pulse 79   Temp 98.4 ?F (36.9 ?C) (Oral)   Resp 16   Ht 5' 3.39" (1.61 m)   Wt 213 lb 9.6 oz (96.9 kg)   LMP 02/24/2012   SpO2 99%   BMI 37.38 kg/m?   ? ?Physical Exam ?Exam conducted with a chaperone present.  ?Constitutional:   ?   General: She is not in acute distress. ?Cardiovascular:  ?   Rate and Rhythm: Normal rate and regular rhythm.  ?Pulmonary:  ?   Effort: Pulmonary effort is normal.  ?   Breath sounds: Normal breath sounds. No wheezing or rhonchi.  ?Abdominal:  ?   Palpations: Abdomen is soft.  ?   Tenderness: There is no abdominal tenderness. There is no right CVA tenderness or left CVA tenderness.  ?   Hernia: No hernia is present.  ?Genitourinary: ?   General: White plaques at posterior fourchette, perineum ?   Urethra: No urethral lesion.  ?   Vagina: No lesions. No bleeding ?Musculoskeletal:  ?   Cervical back: Neck supple.  ?   Right  lower leg: No edema.  ?   Left lower leg: No edema.  ?Lymphadenopathy:  ?   Upper Body:  ?   Right upper body: No supraclavicular adenopathy.  ?   Left upper body: No supraclavicular adenopathy.  ?   Lower Body: No right inguinal adenopathy. No left inguinal adenopathy.  ?Skin: ?   Findings: No rash.  ?Neurological:  ?   Mental Status: She is oriented to person, place, and time.  ? ?Assessment/Plan:  ?Endometrial cancer ([place]) ?  ?History of stage IA grade 1 endometrial cancer ?  ?Negative symptom review, normal exam.  No evidence of recurrence ?Continue follow-up every 6 months for 5 years in accordance with NCCN guidelines.   ?She will follow-up with her NP in 6 mos; return in 1 yr  ? ? ?Lahoma Crocker, MD ?

## 2022-02-13 NOTE — Assessment & Plan Note (Signed)
? ?  History of stage IA grade 1 endometrial cancer ?? ?Negative symptom review, normal exam.  No evidence of recurrence ?Continue follow-up every 6 months for 5 years in accordance with NCCN guidelines. ? ?She will follow-up with her NP in 6 mos; return in 1 yr ?

## 2022-02-14 ENCOUNTER — Encounter: Payer: Self-pay | Admitting: Obstetrics & Gynecology

## 2022-02-14 ENCOUNTER — Inpatient Hospital Stay: Payer: Medicare HMO | Attending: Obstetrics & Gynecology | Admitting: Obstetrics & Gynecology

## 2022-02-14 ENCOUNTER — Other Ambulatory Visit: Payer: Self-pay

## 2022-02-14 VITALS — BP 126/58 | HR 79 | Temp 98.4°F | Resp 16 | Ht 63.39 in | Wt 213.6 lb

## 2022-02-14 DIAGNOSIS — Z8542 Personal history of malignant neoplasm of other parts of uterus: Secondary | ICD-10-CM | POA: Diagnosis not present

## 2022-02-14 DIAGNOSIS — C541 Malignant neoplasm of endometrium: Secondary | ICD-10-CM

## 2022-02-14 NOTE — Patient Instructions (Signed)
Return in 1 yr 

## 2022-05-07 DIAGNOSIS — E7800 Pure hypercholesterolemia, unspecified: Secondary | ICD-10-CM | POA: Diagnosis not present

## 2022-05-07 DIAGNOSIS — K30 Functional dyspepsia: Secondary | ICD-10-CM | POA: Diagnosis not present

## 2022-05-07 DIAGNOSIS — E559 Vitamin D deficiency, unspecified: Secondary | ICD-10-CM | POA: Diagnosis not present

## 2022-05-07 DIAGNOSIS — E1169 Type 2 diabetes mellitus with other specified complication: Secondary | ICD-10-CM | POA: Diagnosis not present

## 2022-05-07 DIAGNOSIS — I10 Essential (primary) hypertension: Secondary | ICD-10-CM | POA: Diagnosis not present

## 2022-08-09 DIAGNOSIS — Z8542 Personal history of malignant neoplasm of other parts of uterus: Secondary | ICD-10-CM | POA: Diagnosis not present

## 2022-08-09 DIAGNOSIS — Z9189 Other specified personal risk factors, not elsewhere classified: Secondary | ICD-10-CM | POA: Diagnosis not present

## 2022-09-30 ENCOUNTER — Other Ambulatory Visit: Payer: Self-pay | Admitting: Family Medicine

## 2022-09-30 DIAGNOSIS — Z1231 Encounter for screening mammogram for malignant neoplasm of breast: Secondary | ICD-10-CM

## 2022-11-04 DIAGNOSIS — K30 Functional dyspepsia: Secondary | ICD-10-CM | POA: Diagnosis not present

## 2022-11-04 DIAGNOSIS — E1169 Type 2 diabetes mellitus with other specified complication: Secondary | ICD-10-CM | POA: Diagnosis not present

## 2022-11-04 DIAGNOSIS — Z1331 Encounter for screening for depression: Secondary | ICD-10-CM | POA: Diagnosis not present

## 2022-11-04 DIAGNOSIS — E7800 Pure hypercholesterolemia, unspecified: Secondary | ICD-10-CM | POA: Diagnosis not present

## 2022-11-04 DIAGNOSIS — Z23 Encounter for immunization: Secondary | ICD-10-CM | POA: Diagnosis not present

## 2022-11-04 DIAGNOSIS — N1831 Chronic kidney disease, stage 3a: Secondary | ICD-10-CM | POA: Diagnosis not present

## 2022-11-04 DIAGNOSIS — E559 Vitamin D deficiency, unspecified: Secondary | ICD-10-CM | POA: Diagnosis not present

## 2022-11-04 DIAGNOSIS — Z0000 Encounter for general adult medical examination without abnormal findings: Secondary | ICD-10-CM | POA: Diagnosis not present

## 2022-11-04 DIAGNOSIS — I10 Essential (primary) hypertension: Secondary | ICD-10-CM | POA: Diagnosis not present

## 2022-12-10 ENCOUNTER — Ambulatory Visit
Admission: RE | Admit: 2022-12-10 | Discharge: 2022-12-10 | Disposition: A | Discharge status: Home / Self Care | Payer: Medicare HMO | Source: Ambulatory Visit | Attending: Family Medicine | Admitting: Family Medicine

## 2022-12-10 DIAGNOSIS — Z1231 Encounter for screening mammogram for malignant neoplasm of breast: Secondary | ICD-10-CM | POA: Diagnosis not present

## 2023-02-04 DIAGNOSIS — H52203 Unspecified astigmatism, bilateral: Secondary | ICD-10-CM | POA: Diagnosis not present

## 2023-02-04 DIAGNOSIS — H25013 Cortical age-related cataract, bilateral: Secondary | ICD-10-CM | POA: Diagnosis not present

## 2023-02-04 DIAGNOSIS — H43813 Vitreous degeneration, bilateral: Secondary | ICD-10-CM | POA: Diagnosis not present

## 2023-02-04 DIAGNOSIS — H1789 Other corneal scars and opacities: Secondary | ICD-10-CM | POA: Diagnosis not present

## 2023-02-04 DIAGNOSIS — H524 Presbyopia: Secondary | ICD-10-CM | POA: Diagnosis not present

## 2023-02-04 DIAGNOSIS — H5213 Myopia, bilateral: Secondary | ICD-10-CM | POA: Diagnosis not present

## 2023-02-04 DIAGNOSIS — E119 Type 2 diabetes mellitus without complications: Secondary | ICD-10-CM | POA: Diagnosis not present

## 2023-02-04 DIAGNOSIS — H2513 Age-related nuclear cataract, bilateral: Secondary | ICD-10-CM | POA: Diagnosis not present

## 2023-02-11 ENCOUNTER — Encounter: Payer: Self-pay | Admitting: Obstetrics & Gynecology

## 2023-02-12 ENCOUNTER — Inpatient Hospital Stay: Payer: Medicare HMO | Attending: Obstetrics & Gynecology | Admitting: Obstetrics & Gynecology

## 2023-02-12 ENCOUNTER — Encounter: Payer: Self-pay | Admitting: Obstetrics & Gynecology

## 2023-02-12 VITALS — BP 127/52 | HR 64 | Temp 98.2°F | Resp 18 | Ht 63.0 in | Wt 210.8 lb

## 2023-02-12 DIAGNOSIS — Z8542 Personal history of malignant neoplasm of other parts of uterus: Secondary | ICD-10-CM

## 2023-02-12 DIAGNOSIS — L280 Lichen simplex chronicus: Secondary | ICD-10-CM

## 2023-02-12 DIAGNOSIS — N9089 Other specified noninflammatory disorders of vulva and perineum: Secondary | ICD-10-CM | POA: Diagnosis not present

## 2023-02-12 DIAGNOSIS — C541 Malignant neoplasm of endometrium: Secondary | ICD-10-CM

## 2023-02-12 MED ORDER — CLOBETASOL PROPIONATE 0.05 % EX OINT: 1.0000 | TOPICAL_OINTMENT | CUTANEOUS | 3 refills | Status: AC

## 2023-02-12 NOTE — Patient Instructions (Signed)
We wish you the best. Please call if there are any questions or concerns.

## 2023-02-12 NOTE — Assessment & Plan Note (Signed)
History of stage IA grade 1 endometrial cancer   Negative symptom review, normal exam.  No evidence of recurrence Continue follow-up every 6 months for 5 years in accordance with NCCN guidelines.   She will follow-up with her NP in 6 mos; return in 1 yr

## 2023-02-12 NOTE — Progress Notes (Signed)
Follow Up Note: Gyn-Onc  Katherine Frederick 72 y.o. female  CC: She returns for a f/u visit   HPI: The oncology history was reviewed.  Interval History: She denies any vaginal bleeding, abdominal/pelvic pain, cough, lethargy or increasing abdominal girth.   Review of Systems  Review of Systems  Constitutional:  Negative for malaise/fatigue and weight loss.  Respiratory:  Negative for shortness of breath and wheezing.   Cardiovascular:  Negative for chest pain and leg swelling.  Gastrointestinal:  Negative for abdominal pain, blood in stool, constipation, nausea and vomiting.  Genitourinary:  Negative for dysuria, frequency, hematuria and urgency.  Musculoskeletal:  Negative for joint pain and myalgias.  Neurological:  Negative for weakness.  Psychiatric/Behavioral:  Negative for depression. The patient does not have insomnia.    Current medications, allergy, social history, past surgical history, past medical history, family history were all reviewed.    Vitals:  BP (!) 127/52 (BP Location: Left Arm, Patient Position: Sitting)   Pulse 64   Temp 98.2 F (36.8 C) (Oral)   Resp 18   Ht '5\' 3"'$  (1.6 m)   Wt 210 lb 12.8 oz (95.6 kg)   LMP 02/24/2012   SpO2 98%   BMI 37.34 kg/m    Physical Exam Exam conducted with a chaperone present.  Constitutional:      General: She is not in acute distress. Cardiovascular:     Rate and Rhythm: Normal rate and regular rhythm.  Pulmonary:     Effort: Pulmonary effort is normal.     Breath sounds: Normal breath sounds. No wheezing or rhonchi.  Abdominal:     Palpations: Abdomen is soft.     Tenderness: There is no abdominal tenderness. There is no right CVA tenderness or left CVA tenderness.     Hernia: No hernia is present.  Genitourinary:    General: White plaques at posterior fourchette, perineum    Urethra: No urethral lesion.     Vagina: No lesions. No bleeding Musculoskeletal:     Cervical back: Neck supple.     Right lower  leg: No edema.     Left lower leg: No edema.  Lymphadenopathy:     Upper Body:     Right upper body: No supraclavicular adenopathy.     Left upper body: No supraclavicular adenopathy.     Lower Body: No right inguinal adenopathy. No left inguinal adenopathy.  Skin:    Findings: No rash.  Neurological:     Mental Status: She is oriented to person, place, and time.   Assessment/Plan:  Endometrial cancer ([facility]) History of stage IA grade 1 endometrial cancer   Negative symptom review, normal exam.  No evidence of recurrence Continue follow-up every 6 months for 5 years in accordance with NCCN guidelines.   She will follow-up with her NP in 6 mos; return in 1 yr    I personally spent 30 minutes face-to-face and non-face-to-face in the care of this patient, which includes all pre, intra, and post visit time on the date of service.    Lahoma Crocker, MD

## 2023-05-15 DIAGNOSIS — E1165 Type 2 diabetes mellitus with hyperglycemia: Secondary | ICD-10-CM | POA: Diagnosis not present

## 2023-05-15 DIAGNOSIS — K30 Functional dyspepsia: Secondary | ICD-10-CM | POA: Diagnosis not present

## 2023-05-15 DIAGNOSIS — E1122 Type 2 diabetes mellitus with diabetic chronic kidney disease: Secondary | ICD-10-CM | POA: Diagnosis not present

## 2023-05-15 DIAGNOSIS — E559 Vitamin D deficiency, unspecified: Secondary | ICD-10-CM | POA: Diagnosis not present

## 2023-05-15 DIAGNOSIS — E1169 Type 2 diabetes mellitus with other specified complication: Secondary | ICD-10-CM | POA: Diagnosis not present

## 2023-05-15 DIAGNOSIS — N1831 Chronic kidney disease, stage 3a: Secondary | ICD-10-CM | POA: Diagnosis not present

## 2023-05-15 DIAGNOSIS — E7800 Pure hypercholesterolemia, unspecified: Secondary | ICD-10-CM | POA: Diagnosis not present

## 2023-05-15 DIAGNOSIS — I10 Essential (primary) hypertension: Secondary | ICD-10-CM | POA: Diagnosis not present

## 2023-06-03 DIAGNOSIS — M542 Cervicalgia: Secondary | ICD-10-CM | POA: Diagnosis not present

## 2023-06-25 DIAGNOSIS — M4316 Spondylolisthesis, lumbar region: Secondary | ICD-10-CM | POA: Diagnosis not present

## 2023-07-24 DIAGNOSIS — M170 Bilateral primary osteoarthritis of knee: Secondary | ICD-10-CM | POA: Diagnosis not present

## 2023-07-24 DIAGNOSIS — M1712 Unilateral primary osteoarthritis, left knee: Secondary | ICD-10-CM | POA: Diagnosis not present

## 2023-07-24 DIAGNOSIS — M1711 Unilateral primary osteoarthritis, right knee: Secondary | ICD-10-CM | POA: Diagnosis not present

## 2023-08-14 ENCOUNTER — Other Ambulatory Visit: Payer: Self-pay | Admitting: Nurse Practitioner

## 2023-08-14 DIAGNOSIS — Z1231 Encounter for screening mammogram for malignant neoplasm of breast: Secondary | ICD-10-CM

## 2023-08-14 DIAGNOSIS — M8580 Other specified disorders of bone density and structure, unspecified site: Secondary | ICD-10-CM

## 2023-08-14 DIAGNOSIS — Z8542 Personal history of malignant neoplasm of other parts of uterus: Secondary | ICD-10-CM | POA: Diagnosis not present

## 2023-08-14 DIAGNOSIS — Z01419 Encounter for gynecological examination (general) (routine) without abnormal findings: Secondary | ICD-10-CM | POA: Diagnosis not present

## 2023-09-11 DIAGNOSIS — M1711 Unilateral primary osteoarthritis, right knee: Secondary | ICD-10-CM | POA: Diagnosis not present

## 2023-10-26 DIAGNOSIS — M542 Cervicalgia: Secondary | ICD-10-CM | POA: Diagnosis not present

## 2023-10-27 DIAGNOSIS — H6123 Impacted cerumen, bilateral: Secondary | ICD-10-CM | POA: Diagnosis not present

## 2023-10-27 DIAGNOSIS — R42 Dizziness and giddiness: Secondary | ICD-10-CM | POA: Diagnosis not present

## 2023-11-13 DIAGNOSIS — E119 Type 2 diabetes mellitus without complications: Secondary | ICD-10-CM | POA: Diagnosis not present

## 2023-11-13 DIAGNOSIS — E118 Type 2 diabetes mellitus with unspecified complications: Secondary | ICD-10-CM | POA: Diagnosis not present

## 2023-11-13 DIAGNOSIS — E782 Mixed hyperlipidemia: Secondary | ICD-10-CM | POA: Diagnosis not present

## 2023-11-13 DIAGNOSIS — Z0000 Encounter for general adult medical examination without abnormal findings: Secondary | ICD-10-CM | POA: Diagnosis not present

## 2023-11-13 DIAGNOSIS — I10 Essential (primary) hypertension: Secondary | ICD-10-CM | POA: Diagnosis not present

## 2023-11-13 DIAGNOSIS — K219 Gastro-esophageal reflux disease without esophagitis: Secondary | ICD-10-CM | POA: Diagnosis not present

## 2023-12-24 ENCOUNTER — Ambulatory Visit
Admission: RE | Admit: 2023-12-24 | Discharge: 2023-12-24 | Disposition: A | Discharge status: Home / Self Care | Payer: Medicare HMO | Source: Ambulatory Visit | Attending: Nurse Practitioner | Admitting: Nurse Practitioner

## 2023-12-24 ENCOUNTER — Ambulatory Visit
Admission: RE | Admit: 2023-12-24 | Discharge: 2023-12-24 | Disposition: A | Discharge status: Home / Self Care | Payer: Medicare HMO | Source: Ambulatory Visit | Attending: Nurse Practitioner

## 2023-12-24 DIAGNOSIS — Z1231 Encounter for screening mammogram for malignant neoplasm of breast: Secondary | ICD-10-CM

## 2023-12-24 DIAGNOSIS — M8580 Other specified disorders of bone density and structure, unspecified site: Secondary | ICD-10-CM

## 2024-11-08 ENCOUNTER — Other Ambulatory Visit: Payer: Self-pay | Admitting: Nurse Practitioner

## 2024-11-08 DIAGNOSIS — Z1231 Encounter for screening mammogram for malignant neoplasm of breast: Secondary | ICD-10-CM

## 2024-11-28 ENCOUNTER — Other Ambulatory Visit: Payer: Self-pay

## 2024-11-28 ENCOUNTER — Emergency Department (HOSPITAL_COMMUNITY)
Admission: EM | Admit: 2024-11-28 | Discharge: 2024-11-28 | Disposition: A | Discharge status: Home / Self Care | Attending: Emergency Medicine | Admitting: Emergency Medicine

## 2024-11-28 ENCOUNTER — Encounter (HOSPITAL_COMMUNITY): Payer: Self-pay

## 2024-11-28 ENCOUNTER — Emergency Department (HOSPITAL_COMMUNITY)

## 2024-11-28 DIAGNOSIS — E119 Type 2 diabetes mellitus without complications: Secondary | ICD-10-CM | POA: Diagnosis not present

## 2024-11-28 DIAGNOSIS — Z79899 Other long term (current) drug therapy: Secondary | ICD-10-CM | POA: Diagnosis not present

## 2024-11-28 DIAGNOSIS — I10 Essential (primary) hypertension: Secondary | ICD-10-CM | POA: Diagnosis not present

## 2024-11-28 DIAGNOSIS — R42 Dizziness and giddiness: Secondary | ICD-10-CM | POA: Insufficient documentation

## 2024-11-28 DIAGNOSIS — R55 Syncope and collapse: Secondary | ICD-10-CM | POA: Diagnosis present

## 2024-11-28 DIAGNOSIS — Z7984 Long term (current) use of oral hypoglycemic drugs: Secondary | ICD-10-CM | POA: Diagnosis not present

## 2024-11-28 LAB — COMPREHENSIVE METABOLIC PANEL WITH GFR
ALT: 23 U/L (ref 0–44)
AST: 29 U/L (ref 15–41)
Albumin: 4 g/dL (ref 3.5–5.0)
Alkaline Phosphatase: 82 U/L (ref 38–126)
Anion gap: 14 (ref 5–15)
BUN: 25 mg/dL — ABNORMAL HIGH (ref 8–23)
CO2: 18 mmol/L — ABNORMAL LOW (ref 22–32)
Calcium: 9 mg/dL (ref 8.9–10.3)
Chloride: 103 mmol/L (ref 98–111)
Creatinine, Ser: 1.22 mg/dL — ABNORMAL HIGH (ref 0.44–1.00)
GFR, Estimated: 47 mL/min — ABNORMAL LOW
Glucose, Bld: 161 mg/dL — ABNORMAL HIGH (ref 70–99)
Potassium: 4.5 mmol/L (ref 3.5–5.1)
Sodium: 135 mmol/L (ref 135–145)
Total Bilirubin: 0.4 mg/dL (ref 0.0–1.2)
Total Protein: 6.9 g/dL (ref 6.5–8.1)

## 2024-11-28 LAB — CBC
HCT: 36.3 % (ref 36.0–46.0)
Hemoglobin: 12.4 g/dL (ref 12.0–15.0)
MCH: 30.6 pg (ref 26.0–34.0)
MCHC: 34.2 g/dL (ref 30.0–36.0)
MCV: 89.6 fL (ref 80.0–100.0)
Platelets: 201 K/uL (ref 150–400)
RBC: 4.05 MIL/uL (ref 3.87–5.11)
RDW: 13.2 % (ref 11.5–15.5)
WBC: 6.8 K/uL (ref 4.0–10.5)
nRBC: 0 % (ref 0.0–0.2)

## 2024-11-28 LAB — URINALYSIS, ROUTINE W REFLEX MICROSCOPIC
Bilirubin Urine: NEGATIVE
Glucose, UA: NEGATIVE mg/dL
Hgb urine dipstick: NEGATIVE
Ketones, ur: NEGATIVE mg/dL
Leukocytes,Ua: NEGATIVE
Nitrite: NEGATIVE
Protein, ur: NEGATIVE mg/dL
Specific Gravity, Urine: 1.02 (ref 1.005–1.030)
pH: 5 (ref 5.0–8.0)

## 2024-11-28 LAB — CBG MONITORING, ED: Glucose-Capillary: 149 mg/dL — ABNORMAL HIGH (ref 70–99)

## 2024-11-28 LAB — TROPONIN T, HIGH SENSITIVITY: Troponin T High Sensitivity: <15 ng/L (ref 0–19)

## 2024-11-28 MED ORDER — SODIUM CHLORIDE 0.9 % IV BOLUS
1000.0000 mL | Freq: Once | INTRAVENOUS | Status: AC
  Administered 2024-11-28: 1000 mL via INTRAVENOUS

## 2024-11-28 NOTE — ED Triage Notes (Signed)
 Pt came in for LOC while sitting down at church. No N&V present, no SOB. Felt lightheaded. Currently A&O x4. NAD

## 2024-11-28 NOTE — Discharge Instructions (Addendum)
 Recommend rehydrate at home with primarily water .  I would hold your losartan  and spironolactone for the next 3 days.  Follow-up for recheck of your kidney function in 2 to 3 days.  Follow up with PCP and consideration of outpatient echo or zio monitor.   Return to ER with new or worsening symptoms.

## 2024-11-28 NOTE — ED Provider Notes (Signed)
 Patient signed out to myself from Warren Shad, PA-C pending UA, IVF, and likely discharge home.  In short patient is a 73 year old female with a past medical history significant for hypertension presenting today for an episode of syncope.  Patient did not fall or have any trauma.  Episode lasted approximately 1 to 2 seconds.  Patient has had some low blood pressure readings at home and is currently taking for blood pressure medications.  Patient no signs or symptoms concerning for seizure-like activity, stroke, or ACS.  UA unremarkable  Considered for admission or further workup however patient's vital signs, physical exam, and labs are reassuring.  Patient advised to hold losartan  and spironolactone for the next 3 days and follow-up with PCP.  Patient given return precautions.  I feel patient is safe for discharge at this time.     Francis Ileana SAILOR, PA-C 11/28/24 1626    Towana Ozell BROCKS, MD 11/29/24 1051

## 2024-11-28 NOTE — ED Provider Notes (Signed)
 " [facility] AT [facility] Provider Note   CSN: [id number] Arrival date & time: 11/28/24  1317     Patient presents with: Loss of Consciousness   Katherine Frederick is a 73 y.o. female.  Patient with past medical history of hypertension, hyperlipidemia, type 2 diabetes presents to emergency room with complaint of loss of consciousness.  Patient reports that she was playing the bells at church when she started feeling dizzy and lightheaded.  She was able to slowly lowered herself onto a seat.  She reports she may or may not have lost consciousness for 1 to 2 seconds.  This was witnessed by her significant other who was in the room. Has had recent URI like symptoms over the past 2-3 days including cough, congestion.   Has had some low BP readings at home, on 4 BP medications for this.   No seizure-like activity or confusion after the episode.  She denies any vision change, slurred speech, facial droop or focal deficit.  She is not have any chest pain shortness of breath or swelling in lower extremities.  No history of exertional shortness of breath or syncope.    Loss of Consciousness      Prior to Admission medications  Medication Sig Start Date End Date Taking? Authorizing Provider  Accu-Chek FastClix Lancets MISC USE 1 LANCET AS DIRECTED USE TO CHECK BLOOD SUGAR ONCE A DAY FOR DIABETES FINGERSTICK    [provider]  acetaminophen  (TYLENOL ) 500 MG tablet Take 1,000 mg by mouth every 6 (six) hours as needed (for pain.).    [provider]  amLODipine (NORVASC) 5 MG tablet Take 5 mg by mouth daily. 11/21/20   [provider]  Calcium Carb-Cholecalciferol (CALCIUM 600+D3 PO) Take 1 tablet by mouth daily.    [provider]  clobetasol  ointment (TEMOVATE ) 0.05 % Apply 1 Application topically 2 (two) times a week. 02/13/23   Rogelio Planas, MD  famotidine  (PEPCID ) 20 MG tablet Take 20 mg by mouth 2 (two) times daily.  10/24/20   [provider]  glimepiride (AMARYL) 1 MG tablet Take 1 mg by mouth every morning.    [provider]  glucose blood (ACCU-CHEK GUIDE) test strip USE 1 STRIP AS DIRECTED USE TO CHECK BLOOD SUGAR ONCE A DAY FOR DIABETES IN VITRO    [provider]  losartan  (COZAAR ) 100 MG tablet Take 100 mg by mouth daily.    [provider]  metFORMIN (GLUCOPHAGE-XR) 500 MG 24 hr tablet 2 tablets 04/25/21   [provider]  metoprolol  (LOPRESSOR ) 50 MG tablet Take 50 mg by mouth 2 (two) times daily.    [provider]  Multiple Vitamin (MULITIVITAMIN WITH MINERALS) TABS Take 1 tablet by mouth at bedtime.     [provider]  simvastatin  (ZOCOR ) 20 MG tablet Take 20 mg by mouth every evening.    [provider]  spironolactone (ALDACTONE) 25 MG tablet Take 25 mg by mouth daily. 11/30/20   [provider]  Vitamin D, Ergocalciferol, (DRISDOL) 50000 UNITS CAPS Take 50,000 Units by mouth every Monday.     [provider]    Allergies: Lisinopril, Quinapril hcl, Triamterene-hctz, and Macrodantin    Review of Systems  Cardiovascular:  Positive for syncope.  Neurological:  Positive for light-headedness.    Updated Vital Signs BP 117/61 (BP Location: Right Arm)   Pulse 62   Temp 97.8 F (36.6 C) (Oral)   Resp 16   Ht  5' 3 (1.6 m)   Wt 95.6 kg   LMP 02/24/2012   SpO2 100%   BMI 37.33 kg/m   Physical Exam Vitals and nursing note reviewed.  Constitutional:      General: She is not in acute distress.    Appearance: She is not toxic-appearing.  HENT:     Head: Normocephalic and atraumatic.  Eyes:     General: No scleral icterus.    Conjunctiva/sclera: Conjunctivae normal.  Cardiovascular:     Rate and Rhythm: Normal rate and regular rhythm.     Pulses: Normal pulses.     Heart sounds: Normal heart sounds.  Pulmonary:     Effort: Pulmonary effort is normal. No respiratory distress.     Breath  sounds: Normal breath sounds.  Abdominal:     General: Abdomen is flat. Bowel sounds are normal.     Palpations: Abdomen is soft.     Tenderness: There is no abdominal tenderness.  Musculoskeletal:     Right lower leg: No edema.     Left lower leg: No edema.  Skin:    General: Skin is warm and dry.     Findings: No lesion.  Neurological:     General: No focal deficit present.     Mental Status: She is alert and oriented to person, place, and time. Mental status is at baseline.     (all labs ordered are listed, but only abnormal results are displayed) Labs Reviewed  COMPREHENSIVE METABOLIC PANEL WITH GFR - Abnormal; Notable for the following components:      Result Value   CO2 18 (*)    Glucose, Bld 161 (*)    BUN 25 (*)    Creatinine, Ser 1.22 (*)    GFR, Estimated 47 (*)    All other components within normal limits  CBG MONITORING, ED - Abnormal; Notable for the following components:   Glucose-Capillary 149 (*)    All other components within normal limits  CBC  URINALYSIS, ROUTINE W REFLEX MICROSCOPIC  CBG MONITORING, ED  TROPONIN T, HIGH SENSITIVITY    EKG: EKG Interpretation Date/Time:  Sunday November 28 2024 13:27:49 EST Ventricular Rate:  61 PR Interval:  117 QRS Duration:  96 QT Interval:  401 QTC Calculation: 404 R Axis:   68  Text Interpretation: Sinus rhythm Borderline short PR interval Confirmed by Darra Chew [phone]) on 11/28/2024 1:36:31 PM  Radiology: [facility] Chest 2 View Result Date: 11/28/2024 CLINICAL DATA:  Cough.  Loss of consciousness. EXAM: CHEST - 2 VIEW COMPARISON:  01/26/2016 FINDINGS: The cardiomediastinal contours are normal. The lungs are clear. Pulmonary vasculature is normal. No consolidation, pleural effusion, or pneumothorax. No acute osseous abnormalities are seen. Mild thoracic spondylosis. IMPRESSION: No active cardiopulmonary disease. Electronically Signed   By: Andrea Gasman M.D.   On: 11/28/2024 14:40     Procedures    Medications Ordered in the ED  sodium chloride  0.9 % bolus 1,000 mL (1,000 mLs Intravenous New Bag/Given 11/28/24 1429)    Clinical Course as of 11/28/24 1513  Sun Nov 28, 2024  1454 Creatinine(!): 1.22 [JB]    Clinical Course User Index [JB] Barbaraann Avans, Warren SAILOR, PA-C                                 Medical Decision Making Amount and/or Complexity of Data Reviewed Labs: ordered. Decision-making details documented in ED Course. Radiology: ordered.   This patient presents to  the ED for concern of syncope, this involves an extensive number of treatment options, and is a complaint that carries with it a high risk of complications and morbidity.  The differential diagnosis includes aortic dissection, pulmonary embolism, ACS, dehydration, electrolyte abnormality, viral URI, arrhythmia, heart failure, valvular pathology   Co morbidities that complicate the patient evaluation  Hypertension, hyperlipidemia, diabetes   Lab Tests:  I personally interpreted labs.  The pertinent results include:   CBC is unremarkable CMP shows elevation in BUN, creatinine, decrease in GFR to 47 Troponin and UA pending at time of sign out.    Imaging Studies ordered:  I ordered imaging studies including chest x-ray  I independently visualized and interpreted imaging which showed no acute findings.  I agree with the radiologist interpretation   Cardiac Monitoring: / EKG:  The patient was maintained on a cardiac monitor.  I personally viewed and interpreted the cardiac monitored which showed an underlying rhythm of: sinus, prolonged PR   Problem List / ED Course / Critical interventions / Medication management  Patient is presenting to ER with complaint of LOC.  Patient reports that she started getting dizzy and feeling somewhat lightheaded and lowered self to the ground.  After sitting symptoms resolved.  EMS arrived and she was feeling back to normal. Does not sound like she had seizure. No focal  deficit.  Sounds less likely to be something like arrhythmia.  No evidence of arrhythmia on EKG. No recent echo, but sounds less to be cardiac in nature and has reassuring EKG and troponin here. No evidence of pneumonia, but has had recent URI like symptoms.  Labs reflect some mild dehydration.  I feel this is likely mild dehydration in the setting of her recent viral syndrome. I ordered medication including 1L NS Reevaluation of the patient after these medicines showed that the patient improved Need to check orthostatic vital signs after 1L NS. I have reviewed the patients home medicines and have made adjustments as needed. Patient signed off to [facility] at shift change.  Pending orthostatics but anticipate discharge and oral rehydration at home.        Final diagnoses:  Syncope, unspecified syncope type    ED Discharge Orders     None          Shermon Warren SAILOR, PA-C 11/28/24 1527  "

## 2024-12-24 ENCOUNTER — Ambulatory Visit
Admission: RE | Admit: 2024-12-24 | Discharge: 2024-12-24 | Disposition: A | Discharge status: Home / Self Care | Source: Ambulatory Visit | Attending: Nurse Practitioner | Admitting: Nurse Practitioner

## 2024-12-24 DIAGNOSIS — Z1231 Encounter for screening mammogram for malignant neoplasm of breast: Secondary | ICD-10-CM

## 2024-12-30 ENCOUNTER — Other Ambulatory Visit: Payer: Self-pay | Admitting: Medical Genetics
